# Patient Record
Sex: Male | Born: 1965 | Race: White | Hispanic: No | Marital: Married | Smoking: Never smoker
Health system: Southern US, Community
[De-identification: ages and names within clinical notes are randomized; demographics above are authoritative.]

## PROBLEM LIST (undated history)

## (undated) DIAGNOSIS — I639 Cerebral infarction, unspecified: Secondary | ICD-10-CM

## (undated) DIAGNOSIS — N400 Enlarged prostate without lower urinary tract symptoms: Secondary | ICD-10-CM

## (undated) DIAGNOSIS — J189 Pneumonia, unspecified organism: Secondary | ICD-10-CM

## (undated) DIAGNOSIS — C801 Malignant (primary) neoplasm, unspecified: Secondary | ICD-10-CM

## (undated) DIAGNOSIS — M1990 Unspecified osteoarthritis, unspecified site: Secondary | ICD-10-CM

## (undated) HISTORY — DX: Pneumonia, unspecified organism: J18.9

## (undated) HISTORY — PX: CHOLECYSTECTOMY: SHX55

## (undated) HISTORY — PX: TRANSURETHRAL RESECTION OF PROSTATE: SHX73

## (undated) HISTORY — PX: COLONOSCOPY: SHX174

---

## 2013-02-03 DIAGNOSIS — I639 Cerebral infarction, unspecified: Secondary | ICD-10-CM

## 2013-02-03 HISTORY — DX: Cerebral infarction, unspecified: I63.9

## 2013-09-08 ENCOUNTER — Ambulatory Visit: Payer: Commercial Managed Care - PPO | Admitting: Neurology

## 2015-03-21 ENCOUNTER — Other Ambulatory Visit: Payer: Self-pay

## 2015-04-02 ENCOUNTER — Ambulatory Visit: Payer: Self-pay | Admitting: Surgery

## 2015-04-02 DIAGNOSIS — C434 Malignant melanoma of scalp and neck: Secondary | ICD-10-CM

## 2015-04-02 NOTE — H&P (Signed)
Jose Andrade. Rattan 04/02/2015 9:27 AM Location: [facility] Patient #: [id number] DOB: 02-Dec-1965 Married / Language: English / Race: White Male  History of Present Illness Jose Andrade A. Jose Roswell MD; 04/02/2015 10:30 AM) Patient words: New-Melanoma scalp     Patient sent at the request of Dr. Allyson Sabal for melanoma involving the parietal part of his scalp just left of midline. The patient had a mole that had been changing over the last 6 months. 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 There is no history of ulceration. His deep margin was 0.25 mm and lateral margin 0.75 mm on the shave biopsy. He denies any other complaints today.  The patient is a 50 year old male.   Other Problems [name], CMA; 04/02/2015 9:27 AM) Cerebrovascular Accident Cholelithiasis  Past Surgical History [name], CMA; 04/02/2015 9:27 AM) Gallbladder Surgery - Laparoscopic  Diagnostic Studies History [name], CMA; 04/02/2015 9:27 AM) Colonoscopy >10 years ago  Allergies [name], CMA; 04/02/2015 9:29 AM) No Known Drug Allergies 04/02/2015  Medication History ([name], CMA; 04/02/2015 9:30 AM) Tamsulosin HCl (0.4MG  Capsule, Oral) Active. Medications Reconciled     Review of Systems [name] CMA; 04/02/2015 9:27 AM) General Not Present- Appetite Loss, Chills, Fatigue, Fever, Night Sweats, Weight Gain and Weight Loss. Skin Present- Change in Wart/Mole. Not Present- Dryness, Hives, Jaundice, New Lesions, Non-Healing Wounds, Rash and Ulcer. HEENT Not Present- Earache, Hearing Loss, Hoarseness, Nose Bleed, Oral Ulcers, Ringing in the Ears, Seasonal Allergies, Sinus Pain, Sore Throat, Visual Disturbances, Wears glasses/contact lenses and Yellow Eyes. Breast Not Present- Breast Mass, Breast Pain, Nipple Discharge and Skin Changes. Cardiovascular Present- Leg Cramps. Not Present- Chest Pain, Difficulty Breathing  Lying Down, Palpitations, Rapid Heart Rate, Shortness of Breath and Swelling of Extremities. Gastrointestinal Not Present- Abdominal Pain, Bloating, Bloody Stool, Change in Bowel Habits, Chronic diarrhea, Constipation, Difficulty Swallowing, Excessive gas, Gets full quickly at meals, Hemorrhoids, Indigestion, Nausea, Rectal Pain and Vomiting. Musculoskeletal Present- Joint Pain and Joint Stiffness. Not Present- Back Pain, Muscle Pain, Muscle Weakness and Swelling of Extremities. Neurological Present- Numbness. Not Present- Decreased Memory, Fainting, Headaches, Seizures, Tingling, Tremor, Trouble walking and Weakness. Psychiatric Not Present- Anxiety, Bipolar, Change in Sleep Pattern, Depression, Fearful and Frequent crying. Endocrine Not Present- Cold Intolerance, Excessive Hunger, Hair Changes, Heat Intolerance, Hot flashes and New Diabetes. Hematology Not Present- Easy Bruising, Excessive bleeding, Gland problems, HIV and Persistent Infections.  Vitals ([name] CMA; 04/02/2015 9:29 AM) 04/02/2015 9:29 AM Weight: 238.8 lb Height: 70in Body Surface Area: 2.25 m Body Mass Index: 34.26 kg/m  Temp.: 97.62F(Oral)  BP: 112/80 (Sitting, Left Arm, Standard)      Physical Exam (Imo Cumbie A. Morio Widen MD; 04/02/2015 10:31 AM)  General Mental Status-Alert. General Appearance-Consistent with stated age. Hydration-Well hydrated. Voice-Normal.  Integumentary Note: Parietal scalp 1 cm wound noted. This is just left of midline. His head is shaved.  Head and Neck Head-normocephalic, atraumatic with no lesions or palpable masses. Trachea-midline. Thyroid Gland Characteristics - normal size and consistency.  Eye Eyeball - Bilateral-Extraocular movements intact. Sclera/Conjunctiva - Bilateral-No scleral icterus.  Chest and Lung Exam Chest and lung exam reveals -quiet, even and easy respiratory effort with no use of accessory muscles and on auscultation, normal  breath sounds, no adventitious sounds and normal vocal resonance. Inspection Chest Wall - Normal. Back - normal.  Cardiovascular Cardiovascular examination reveals -normal heart sounds, regular rate and rhythm with no murmurs and normal pedal pulses bilaterally.  Neurologic  Neurologic evaluation reveals -alert and oriented x 3 with no impairment of recent or remote memory. Mental Status-Normal.  Musculoskeletal Normal Exam - Left-Upper Extremity Strength Normal and Lower Extremity Strength Normal. Normal Exam - Right-Upper Extremity Strength Normal and Lower Extremity Strength Normal.  Lymphatic Head & Neck  General Head & Neck Lymphatics: Bilateral - Description - Normal. Axillary  General Axillary Region: Bilateral - Description - Normal. Tenderness - Non Tender.    Assessment & Plan (Angle Dirusso A. 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  Discussed surgery with the patient is Y today. Risks, benefits and alternatives discussed. Discussed the potential wound issues all excising a melanoma of the scalp. Discussed sentinel lymph node mapping in the cervical region as well as possible axilla and potential complications of doing this. They wish to proceed with surgery. Risk of sentinel lymph node mapping include bleeding, infection, lymphedema, shoulder pain. stiffness, dye allergy. cosmetic deformity , blood clots, death, need for more surgery. Pt agres to proceed.    Risk of bleeding, infection, cosmetic deformity, open wound, slow wound healing, and the need for other procedures, nerve injury, blood vessel injury, DVT, cardiovascular complications, death, DVT  DISCUSSED THE NEED FOR SKIN GRAFT, FLAPS AND OTHER CLOSURE TEQUNIQUES   PT AGREES TO PROCEED  Current Plans The anatomy and the physiology was  discussed. The pathophysiology and natural history of the disease was discussed. Options were discussed and recommendations were made. Technique, risks, benefits, & alternatives were discussed. Risks such as stroke, heart attack, bleeding, indection, death, and other risks discussed. Questions answered. The patient agrees to proceed. Pt Education - Melanoma: cancer The pathophysiology of skin & subcutaneous masses was discussed. Natural history risks without surgery were discussed. I recommended surgery to remove the mass. I explained the technique of removal with use of local anesthesia & possible need for more aggressive sedation/anesthesia for patient comfort.  Risks such as bleeding, infection, wound breakdown, heart attack, death, and other risks were discussed. I noted a good likelihood this will help address the problem. Possibility that this will not correct all symptoms was explained. Possibility of regrowth/recurrence of the mass was discussed. We will work to minimize complications. Questions were answered. The patient expresses understanding & wishes to proceed with surgery.  Pt Education - Melanoma: discussed with patient and provided information.

## 2015-04-10 ENCOUNTER — Other Ambulatory Visit: Payer: Self-pay | Admitting: Surgery

## 2015-04-10 DIAGNOSIS — C434 Malignant melanoma of scalp and neck: Secondary | ICD-10-CM

## 2015-04-16 ENCOUNTER — Ambulatory Visit (HOSPITAL_COMMUNITY): Payer: No Typology Code available for payment source

## 2015-04-16 ENCOUNTER — Ambulatory Visit (HOSPITAL_COMMUNITY)
Admission: RE | Admit: 2015-04-16 | Discharge: 2015-04-16 | Disposition: A | Discharge status: Home / Self Care | Payer: No Typology Code available for payment source | Source: Ambulatory Visit | Attending: Surgery | Admitting: Surgery

## 2015-04-16 DIAGNOSIS — C434 Malignant melanoma of scalp and neck: Secondary | ICD-10-CM | POA: Diagnosis not present

## 2015-04-16 MED ORDER — TECHNETIUM TC 99M SULFUR COLLOID FILTERED
1.0000 | Freq: Once | INTRAVENOUS | Status: AC | PRN
  Administered 2015-04-16: 1 via INTRADERMAL

## 2015-04-17 ENCOUNTER — Ambulatory Visit (HOSPITAL_COMMUNITY): Payer: No Typology Code available for payment source

## 2015-05-02 ENCOUNTER — Encounter (HOSPITAL_BASED_OUTPATIENT_CLINIC_OR_DEPARTMENT_OTHER): Payer: Self-pay | Admitting: *Deleted

## 2015-05-09 ENCOUNTER — Ambulatory Visit (HOSPITAL_BASED_OUTPATIENT_CLINIC_OR_DEPARTMENT_OTHER)
Admission: RE | Admit: 2015-05-09 | Discharge: 2015-05-09 | Disposition: A | Discharge status: Home / Self Care | Payer: No Typology Code available for payment source | Source: Ambulatory Visit | Attending: Surgery | Admitting: Surgery

## 2015-05-09 ENCOUNTER — Encounter (HOSPITAL_COMMUNITY)
Admission: RE | Admit: 2015-05-09 | Discharge: 2015-05-09 | Disposition: A | Discharge status: Home / Self Care | Payer: No Typology Code available for payment source | Source: Ambulatory Visit | Attending: Surgery | Admitting: Surgery

## 2015-05-09 ENCOUNTER — Ambulatory Visit (HOSPITAL_BASED_OUTPATIENT_CLINIC_OR_DEPARTMENT_OTHER): Payer: No Typology Code available for payment source | Admitting: Certified Registered"

## 2015-05-09 ENCOUNTER — Encounter (HOSPITAL_BASED_OUTPATIENT_CLINIC_OR_DEPARTMENT_OTHER): Payer: Self-pay

## 2015-05-09 ENCOUNTER — Encounter (HOSPITAL_BASED_OUTPATIENT_CLINIC_OR_DEPARTMENT_OTHER)
Admission: RE | Disposition: A | Discharge status: Home / Self Care | Payer: Self-pay | Source: Ambulatory Visit | Attending: Surgery

## 2015-05-09 DIAGNOSIS — Z6834 Body mass index (BMI) 34.0-34.9, adult: Secondary | ICD-10-CM | POA: Diagnosis not present

## 2015-05-09 DIAGNOSIS — C434 Malignant melanoma of scalp and neck: Secondary | ICD-10-CM | POA: Insufficient documentation

## 2015-05-09 DIAGNOSIS — E669 Obesity, unspecified: Secondary | ICD-10-CM | POA: Insufficient documentation

## 2015-05-09 DIAGNOSIS — Z7982 Long term (current) use of aspirin: Secondary | ICD-10-CM | POA: Insufficient documentation

## 2015-05-09 DIAGNOSIS — I69398 Other sequelae of cerebral infarction: Secondary | ICD-10-CM | POA: Insufficient documentation

## 2015-05-09 DIAGNOSIS — R200 Anesthesia of skin: Secondary | ICD-10-CM | POA: Insufficient documentation

## 2015-05-09 DIAGNOSIS — Z79899 Other long term (current) drug therapy: Secondary | ICD-10-CM | POA: Insufficient documentation

## 2015-05-09 HISTORY — DX: Unspecified osteoarthritis, unspecified site: M19.90

## 2015-05-09 HISTORY — DX: Malignant (primary) neoplasm, unspecified: C80.1

## 2015-05-09 HISTORY — PX: MELANOMA EXCISION WITH SENTINEL LYMPH NODE BIOPSY: SHX5267

## 2015-05-09 HISTORY — DX: Cerebral infarction, unspecified: I63.9

## 2015-05-09 HISTORY — DX: Benign prostatic hyperplasia without lower urinary tract symptoms: N40.0

## 2015-05-09 SURGERY — MELANOMA EXCISION WITH SENTINEL LYMPH NODE BIOPSY
Anesthesia: General | Site: Head

## 2015-05-09 MED ORDER — ONDANSETRON HCL 4 MG/2ML IJ SOLN
INTRAMUSCULAR | Status: AC
  Filled 2015-05-09: qty 2

## 2015-05-09 MED ORDER — OXYCODONE HCL 5 MG PO TABS
5.0000 mg | ORAL_TABLET | Freq: Once | ORAL | Status: AC | PRN
  Administered 2015-05-09: 5 mg via ORAL

## 2015-05-09 MED ORDER — FENTANYL CITRATE (PF) 100 MCG/2ML IJ SOLN
INTRAMUSCULAR | Status: AC
  Filled 2015-05-09: qty 2

## 2015-05-09 MED ORDER — SUCCINYLCHOLINE CHLORIDE 20 MG/ML IJ SOLN
INTRAMUSCULAR | Status: AC
  Filled 2015-05-09: qty 1

## 2015-05-09 MED ORDER — TECHNETIUM TC 99M SULFUR COLLOID FILTERED
0.5000 | Freq: Once | INTRAVENOUS | Status: AC | PRN
  Administered 2015-05-09: 0.5 via INTRADERMAL

## 2015-05-09 MED ORDER — CEFAZOLIN SODIUM 1-5 GM-% IV SOLN
INTRAVENOUS | Status: AC
  Filled 2015-05-09: qty 50

## 2015-05-09 MED ORDER — FENTANYL CITRATE (PF) 100 MCG/2ML IJ SOLN
50.0000 ug | INTRAMUSCULAR | Status: DC | PRN
  Administered 2015-05-09: 50 ug via INTRAVENOUS
  Administered 2015-05-09: 100 ug via INTRAVENOUS

## 2015-05-09 MED ORDER — CHLORHEXIDINE GLUCONATE 4 % EX LIQD: 1.0000 "application " | Freq: Once | CUTANEOUS | Status: DC

## 2015-05-09 MED ORDER — LIDOCAINE HCL (CARDIAC) 20 MG/ML IV SOLN
INTRAVENOUS | Status: DC | PRN
  Administered 2015-05-09: 60 mg via INTRAVENOUS

## 2015-05-09 MED ORDER — OXYCODONE HCL 5 MG PO TABS
ORAL_TABLET | ORAL | Status: AC
  Filled 2015-05-09: qty 1

## 2015-05-09 MED ORDER — SUCCINYLCHOLINE CHLORIDE 20 MG/ML IJ SOLN
INTRAMUSCULAR | Status: DC | PRN
  Administered 2015-05-09: 100 mg via INTRAVENOUS

## 2015-05-09 MED ORDER — LACTATED RINGERS IV SOLN
INTRAVENOUS | Status: DC
  Administered 2015-05-09: 10:00:00 via INTRAVENOUS
  Administered 2015-05-09: 10 mL/h via INTRAVENOUS

## 2015-05-09 MED ORDER — SCOPOLAMINE 1 MG/3DAYS TD PT72: 1.0000 | MEDICATED_PATCH | Freq: Once | TRANSDERMAL | Status: DC | PRN

## 2015-05-09 MED ORDER — PHENYLEPHRINE 40 MCG/ML (10ML) SYRINGE FOR IV PUSH (FOR BLOOD PRESSURE SUPPORT)
PREFILLED_SYRINGE | INTRAVENOUS | Status: AC
  Filled 2015-05-09: qty 10

## 2015-05-09 MED ORDER — DEXAMETHASONE SODIUM PHOSPHATE 10 MG/ML IJ SOLN
INTRAMUSCULAR | Status: AC
  Filled 2015-05-09: qty 1

## 2015-05-09 MED ORDER — BACITRACIN-NEOMYCIN-POLYMYXIN 400-5-5000 EX OINT
TOPICAL_OINTMENT | CUTANEOUS | Status: DC | PRN
  Administered 2015-05-09: 1 via TOPICAL

## 2015-05-09 MED ORDER — MIDAZOLAM HCL 2 MG/2ML IJ SOLN
INTRAMUSCULAR | Status: AC
  Filled 2015-05-09: qty 2

## 2015-05-09 MED ORDER — CEFAZOLIN SODIUM 1 G IJ SOLR
3.0000 g | INTRAMUSCULAR | Status: AC
  Administered 2015-05-09: 2 g via INTRAVENOUS

## 2015-05-09 MED ORDER — GLYCOPYRROLATE 0.2 MG/ML IJ SOLN: 0.2000 mg | Freq: Once | INTRAMUSCULAR | Status: DC | PRN

## 2015-05-09 MED ORDER — DEXAMETHASONE SODIUM PHOSPHATE 4 MG/ML IJ SOLN
INTRAMUSCULAR | Status: DC | PRN
  Administered 2015-05-09: 10 mg via INTRAVENOUS

## 2015-05-09 MED ORDER — PROMETHAZINE HCL 25 MG/ML IJ SOLN: 6.2500 mg | INTRAMUSCULAR | Status: DC | PRN

## 2015-05-09 MED ORDER — ONDANSETRON HCL 4 MG/2ML IJ SOLN
INTRAMUSCULAR | Status: DC | PRN
  Administered 2015-05-09: 4 mg via INTRAVENOUS

## 2015-05-09 MED ORDER — PROPOFOL 500 MG/50ML IV EMUL
INTRAVENOUS | Status: AC
  Filled 2015-05-09: qty 50

## 2015-05-09 MED ORDER — OXYCODONE-ACETAMINOPHEN 5-325 MG PO TABS: 1.0000 | ORAL_TABLET | ORAL | Status: DC | PRN

## 2015-05-09 MED ORDER — PHENYLEPHRINE HCL 10 MG/ML IJ SOLN
INTRAMUSCULAR | Status: DC | PRN
  Administered 2015-05-09 (×4): 40 ug via INTRAVENOUS

## 2015-05-09 MED ORDER — CEFAZOLIN SODIUM-DEXTROSE 2-4 GM/100ML-% IV SOLN
INTRAVENOUS | Status: AC
  Filled 2015-05-09: qty 100

## 2015-05-09 MED ORDER — LIDOCAINE HCL (CARDIAC) 20 MG/ML IV SOLN
INTRAVENOUS | Status: AC
  Filled 2015-05-09: qty 5

## 2015-05-09 MED ORDER — FENTANYL CITRATE (PF) 100 MCG/2ML IJ SOLN
25.0000 ug | INTRAMUSCULAR | Status: DC | PRN
  Administered 2015-05-09 (×3): 25 ug via INTRAVENOUS
  Administered 2015-05-09: 50 ug via INTRAVENOUS

## 2015-05-09 MED ORDER — BUPIVACAINE-EPINEPHRINE 0.25% -1:200000 IJ SOLN
INTRAMUSCULAR | Status: DC | PRN
  Administered 2015-05-09: 6 mL

## 2015-05-09 MED ORDER — METHYLENE BLUE 0.5 % INJ SOLN
INTRAVENOUS | Status: DC | PRN
  Administered 2015-05-09: 1 mL

## 2015-05-09 MED ORDER — PROPOFOL 10 MG/ML IV BOLUS
INTRAVENOUS | Status: DC | PRN
  Administered 2015-05-09: 150 mg via INTRAVENOUS

## 2015-05-09 MED ORDER — MIDAZOLAM HCL 2 MG/2ML IJ SOLN
1.0000 mg | INTRAMUSCULAR | Status: DC | PRN
  Administered 2015-05-09 (×2): 2 mg via INTRAVENOUS

## 2015-05-09 SURGICAL SUPPLY — 42 items
BLADE SURG 15 STRL LF DISP TIS (BLADE) ×2 IMPLANT
BLADE SURG 15 STRL SS (BLADE) ×4
CANISTER SUCT 1200ML W/VALVE (MISCELLANEOUS) ×3 IMPLANT
COVER BACK TABLE 60X90IN (DRAPES) ×3 IMPLANT
COVER MAYO STAND STRL (DRAPES) ×3 IMPLANT
COVER PROBE W GEL 5X96 (DRAPES) ×3 IMPLANT
DRAPE LAPAROTOMY 100X72 PEDS (DRAPES) ×3 IMPLANT
DRAPE UTILITY XL STRL (DRAPES) ×3 IMPLANT
ELECT COATED BLADE 2.86 ST (ELECTRODE) ×3 IMPLANT
ELECT REM PT RETURN 9FT ADLT (ELECTROSURGICAL) ×3
ELECTRODE REM PT RTRN 9FT ADLT (ELECTROSURGICAL) ×1 IMPLANT
GLOVE BIO SURGEON STRL SZ8 (GLOVE) ×3 IMPLANT
GLOVE BIOGEL PI IND STRL 8 (GLOVE) ×2 IMPLANT
GLOVE BIOGEL PI INDICATOR 8 (GLOVE) ×4
GLOVE ECLIPSE 8.0 STRL XLNG CF (GLOVE) ×3 IMPLANT
GOWN STRL REUS W/ TWL LRG LVL3 (GOWN DISPOSABLE) ×2 IMPLANT
GOWN STRL REUS W/TWL LRG LVL3 (GOWN DISPOSABLE) ×4
LIQUID BAND (GAUZE/BANDAGES/DRESSINGS) ×3 IMPLANT
NDL SAFETY ECLIPSE 18X1.5 (NEEDLE) ×1 IMPLANT
NEEDLE HYPO 18GX1.5 SHARP (NEEDLE) ×2
NEEDLE HYPO 25X1 1.5 SAFETY (NEEDLE) ×6 IMPLANT
NS IRRIG 1000ML POUR BTL (IV SOLUTION) ×3 IMPLANT
PACK BASIN DAY SURGERY FS (CUSTOM PROCEDURE TRAY) ×3 IMPLANT
PENCIL BUTTON HOLSTER BLD 10FT (ELECTRODE) ×3 IMPLANT
SLEEVE SCD COMPRESS KNEE MED (MISCELLANEOUS) ×3 IMPLANT
SPONGE GAUZE 4X4 12PLY STER LF (GAUZE/BANDAGES/DRESSINGS) ×3 IMPLANT
SPONGE LAP 4X18 X RAY DECT (DISPOSABLE) ×3 IMPLANT
STAPLER VISISTAT 35W (STAPLE) ×3 IMPLANT
SUT ETHILON 2 0 FS 18 (SUTURE) ×6 IMPLANT
SUT MON AB 4-0 PC3 18 (SUTURE) ×3 IMPLANT
SUT VIC AB 0 SH 27 (SUTURE) ×3 IMPLANT
SUT VIC AB 2-0 SH 27 (SUTURE) ×2
SUT VIC AB 2-0 SH 27XBRD (SUTURE) ×1 IMPLANT
SUT VICRYL 3-0 CR8 SH (SUTURE) ×3 IMPLANT
SYR 5ML LL (SYRINGE) ×3 IMPLANT
SYR CONTROL 10ML LL (SYRINGE) ×3 IMPLANT
TOWEL OR 17X24 6PK STRL BLUE (TOWEL DISPOSABLE) ×3 IMPLANT
TOWEL OR NON WOVEN STRL DISP B (DISPOSABLE) ×3 IMPLANT
TRAY DSU PREP LF (CUSTOM PROCEDURE TRAY) ×3 IMPLANT
TUBE CONNECTING 20'X1/4 (TUBING) ×1
TUBE CONNECTING 20X1/4 (TUBING) ×2 IMPLANT
YANKAUER SUCT BULB TIP NO VENT (SUCTIONS) ×3 IMPLANT

## 2015-05-09 NOTE — Anesthesia Preprocedure Evaluation (Addendum)
Anesthesia Evaluation  Patient identified by MRN, date of birth, ID band Patient awake    Reviewed: Allergy & Precautions, NPO status , Patient's Chart, lab work & pertinent test results  Airway Mallampati: II  TM Distance: >3 FB Neck ROM: Full    Dental  (+) Teeth Intact, Dental Advisory Given   Pulmonary neg pulmonary ROS,    Pulmonary exam normal breath sounds clear to auscultation       Cardiovascular Exercise Tolerance: Good negative cardio ROS Normal cardiovascular exam Rhythm:Regular Rate:Normal     Neuro/Psych CVA (left UE numbness), Residual Symptoms    GI/Hepatic negative GI ROS, Neg liver ROS,   Endo/Other  Obesity   Renal/GU negative Renal ROS     Musculoskeletal  (+) Arthritis , Osteoarthritis,    Abdominal   Peds  Hematology negative hematology ROS (+)   Anesthesia Other Findings Day of surgery medications reviewed with the patient.  Scalp melanoma  Reproductive/Obstetrics                            Anesthesia Physical Anesthesia Plan  ASA: III  Anesthesia Plan: General   Post-op Pain Management:    Induction: Intravenous  Airway Management Planned: Oral ETT  Additional Equipment:   Intra-op Plan:   Post-operative Plan: Extubation in OR  Informed Consent: I have reviewed the patients History and Physical, chart, labs and discussed the procedure including the risks, benefits and alternatives for the proposed anesthesia with the patient or authorized representative who has indicated his/her understanding and acceptance.   Dental advisory given  Plan Discussed with: CRNA  Anesthesia Plan Comments: (Risks/benefits of general anesthesia discussed with patient including risk of damage to teeth, lips, gum, and tongue, nausea/vomiting, allergic reactions to medications, and the possibility of heart attack, stroke and death.  All patient questions answered.  Patient  wishes to proceed.)        Anesthesia Quick Evaluation

## 2015-05-09 NOTE — Discharge Instructions (Signed)
Discharge instructions per Dr.Cornett:  Ice pack to scalp. Neosporin to scalp. May shower tomorrow. May drive when no longer taking pain medication. Make an appointment to see Dr. Brantley Stage next week.    Post Anesthesia Home Care Instructions  Activity: Get plenty of rest for the remainder of the day. A responsible adult should stay with you for 24 hours following the procedure.  For the next 24 hours, DO NOT: -Drive a car -Paediatric nurse -Drink alcoholic beverages -Take any medication unless instructed by your physician -Make any legal decisions or sign important papers.  Meals: Start with liquid foods such as gelatin or soup. Progress to regular foods as tolerated. Avoid greasy, spicy, heavy foods. If nausea and/or vomiting occur, drink only clear liquids until the nausea and/or vomiting subsides. Call your physician if vomiting continues.  Special Instructions/Symptoms: Your throat may feel dry or sore from the anesthesia or the breathing tube placed in your throat during surgery. If this causes discomfort, gargle with warm salt water. The discomfort should disappear within 24 hours.  If you had a scopolamine patch placed behind your ear for the management of post- operative nausea and/or vomiting:  1. The medication in the patch is effective for 72 hours, after which it should be removed.  Wrap patch in a tissue and discard in the trash. Wash hands thoroughly with soap and water. 2. You may remove the patch earlier than 72 hours if you experience unpleasant side effects which may include dry mouth, dizziness or visual disturbances. 3. Avoid touching the patch. Wash your hands with soap and water after contact with the patch.

## 2015-05-09 NOTE — Brief Op Note (Signed)
05/09/2015  11:48 AM  PATIENT:  Jose Andrade  50 y.o. male  PRE-OPERATIVE DIAGNOSIS:  MELANOMA SCALP  POST-OPERATIVE DIAGNOSIS:  MELANOMA SCALP  PROCEDURE:  Procedure(s): WIDE EXCISION MELANOMA AND  SENTINEL LYMPH NODE MAPPING (N/A)  SURGEON:  Surgeon(s) and Role:    * Erroll Luna, MD - Primary  PHYSICIAN ASSISTANT:     ANESTHESIA:   local and general  EBL:  Total I/O In: 1600 [I.V.:1600] Out: -   BLOOD ADMINISTERED:none  DRAINS: none   LOCAL MEDICATIONS USED:  BUPIVICAINE   SPECIMEN:  Source of Specimen:  scalp and left level 2 cervical lymph nodes   DISPOSITION OF SPECIMEN:  PATHOLOGY  COUNTS:  YES  TOURNIQUET:  * No tourniquets in log *  DICTATION: .Other Dictation: Dictation Number  [id number]  PLAN OF CARE: Discharge to home after PACU  PATIENT DISPOSITION:  PACU - hemodynamically stable.   Delay start of Pharmacological VTE agent (>24hrs) due to surgical blood loss or risk of bleeding: not applicable

## 2015-05-09 NOTE — Transfer of Care (Signed)
Immediate Anesthesia Transfer of Care Note  Patient: Jose Andrade  Procedure(s) Performed: Procedure(s): WIDE EXCISION MELANOMA AND  SENTINEL LYMPH NODE MAPPING (N/A)  Patient Location: PACU  Anesthesia Type:General  Level of Consciousness: awake, alert , oriented and patient cooperative  Airway & Oxygen Therapy: Patient Spontanous Breathing and Patient connected to face mask oxygen  Post-op Assessment: Report given to RN and Post -op Vital signs reviewed and stable  Post vital signs: Reviewed and stable  Last Vitals:  Filed Vitals:   05/09/15 0830 05/09/15 0835  BP: 126/81   Pulse: 68 59  Temp:    Resp: 9 9    Complications: No apparent anesthesia complications

## 2015-05-09 NOTE — H&P (Signed)
H&P   Jose Andrade (MR# [id number])      H&P Info    Author Note Status Last Update User Last Update Date/Time   Jose Luna, MD Signed Jose Luna, MD 04/02/2015 10:36 AM    H&P    Expand All Collapse All   Jose Andrade 04/02/2015 9:27 AM Location: [facility] Patient #: [id number] DOB: 09-30-1965 Married / Language: English / Race: White Male  History of Present Illness Jose Moores A. Bettyann Birchler MD; 04/02/2015 10:30 AM) Patient words: New-Melanoma scalp     Patient sent at the request of Dr. Allyson Andrade for melanoma involving the parietal part of his scalp just left of midline. The patient had a mole that had been changing over the last 6 months. 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 There is no history of ulceration. His deep margin was 0.25 mm and lateral margin 0.75 mm on the shave biopsy. He denies any other complaints today.  The patient is a 50 year old male.   Other Problems Jose Andrade, CMA; 04/02/2015 9:27 AM) Cerebrovascular Accident Cholelithiasis  Past Surgical History Jose Andrade, CMA; 04/02/2015 9:27 AM) Gallbladder Surgery - Laparoscopic  Diagnostic Studies History Jose Andrade, CMA; 04/02/2015 9:27 AM) Colonoscopy >10 years ago  Allergies Jose Andrade, CMA; 04/02/2015 9:29 AM) No Known Drug Allergies 04/02/2015  Medication History (Jose Andrade, CMA; 04/02/2015 9:30 AM) Tamsulosin HCl (0.4MG  Capsule, Oral) Active. Medications Reconciled     Review of Systems Jose Andrade CMA; 04/02/2015 9:27 AM) General Not Present- Appetite Loss, Chills, Fatigue, Fever, Night Sweats, Weight Gain and Weight Loss. Skin Present- Change in Wart/Mole. Not Present- Dryness, Hives, Jaundice, New Lesions, Non-Healing Wounds, Rash and Ulcer. HEENT Not Present- Earache, Hearing Loss, Hoarseness, Nose Bleed, Oral Ulcers, Ringing in the Ears, Seasonal Allergies, Sinus Pain, Sore Throat,  Visual Disturbances, Wears glasses/contact lenses and Yellow Eyes. Breast Not Present- Breast Mass, Breast Pain, Nipple Discharge and Skin Changes. Cardiovascular Present- Leg Cramps. Not Present- Chest Pain, Difficulty Breathing Lying Down, Palpitations, Rapid Heart Rate, Shortness of Breath and Swelling of Extremities. Gastrointestinal Not Present- Abdominal Pain, Bloating, Bloody Stool, Change in Bowel Habits, Chronic diarrhea, Constipation, Difficulty Swallowing, Excessive gas, Gets full quickly at meals, Hemorrhoids, Indigestion, Nausea, Rectal Pain and Vomiting. Musculoskeletal Present- Joint Pain and Joint Stiffness. Not Present- Back Pain, Muscle Pain, Muscle Weakness and Swelling of Extremities. Neurological Present- Numbness. Not Present- Decreased Memory, Fainting, Headaches, Seizures, Tingling, Tremor, Trouble walking and Weakness. Psychiatric Not Present- Anxiety, Bipolar, Change in Sleep Pattern, Depression, Fearful and Frequent crying. Endocrine Not Present- Cold Intolerance, Excessive Hunger, Hair Changes, Heat Intolerance, Hot flashes and New Diabetes. Hematology Not Present- Easy Bruising, Excessive bleeding, Gland problems, HIV and Persistent Infections.  Vitals (Jose Andrade CMA; 04/02/2015 9:29 AM) 04/02/2015 9:29 AM Weight: 238.8 lb Height: 70in Body Surface Area: 2.25 m Body Mass Index: 34.26 kg/m  Temp.: 97.52F(Oral)  BP: 112/80 (Sitting, Left Arm, Standard)      Physical Exam (Jose Lafrance A. Adabella Stanis MD; 04/02/2015 10:31 AM)  General Mental Status-Alert. General Appearance-Consistent with stated age. Hydration-Well hydrated. Voice-Normal.  Integumentary Note: Parietal scalp 1 cm wound noted. This is just left of midline. His head is shaved.  Head and Neck Head-normocephalic, atraumatic with no lesions or palpable masses. Trachea-midline. Thyroid Gland Characteristics - normal size and consistency.  Eye Eyeball - Bilateral-Extraocular  movements intact. Sclera/Conjunctiva - Bilateral-No scleral icterus.  Chest and Lung Exam Chest and lung exam reveals -quiet, even and  easy respiratory effort with no use of accessory muscles and on auscultation, normal breath sounds, no adventitious sounds and normal vocal resonance. Inspection Chest Wall - Normal. Back - normal.  Cardiovascular Cardiovascular examination reveals -normal heart sounds, regular rate and rhythm with no murmurs and normal pedal pulses bilaterally.  Neurologic Neurologic evaluation reveals -alert and oriented x 3 with no impairment of recent or remote memory. Mental Status-Normal.  Musculoskeletal Normal Exam - Left-Upper Extremity Strength Normal and Lower Extremity Strength Normal. Normal Exam - Right-Upper Extremity Strength Normal and Lower Extremity Strength Normal.  Lymphatic Head & Neck  General Head & Neck Lymphatics: Bilateral - Description - Normal. Axillary  General Axillary Region: Bilateral - Description - Normal. Tenderness - Non Tender.    Assessment & Plan (Jose Andrade A. 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  Discussed surgery with the patient is Y today. Risks, benefits and alternatives discussed. Discussed the potential wound issues all excising a melanoma of the scalp. Discussed sentinel lymph node mapping in the cervical region as well as possible axilla and potential complications of doing this. They wish to proceed with surgery. Risk of sentinel lymph node mapping include bleeding, infection, lymphedema, shoulder pain. stiffness, dye allergy. cosmetic deformity , blood clots, death, need for more surgery. Pt agres to proceed.    Risk of bleeding, infection, cosmetic deformity, open wound, slow wound healing, and the need for other procedures, nerve injury,  blood vessel injury, DVT, cardiovascular complications, death, DVT  DISCUSSED THE NEED FOR SKIN GRAFT, FLAPS AND OTHER CLOSURE TEQUNIQUES   PT AGREES TO PROCEED  Current Plans The anatomy and the physiology was discussed. The pathophysiology and natural history of the disease was discussed. Options were discussed and recommendations were made. Technique, risks, benefits, & alternatives were discussed. Risks such as stroke, heart attack, bleeding, indection, death, and other risks discussed. Questions answered. The patient agrees to proceed. Pt Education - Melanoma: cancer The pathophysiology of skin & subcutaneous masses was discussed. Natural history risks without surgery were discussed. I recommended surgery to remove the mass. I explained the technique of removal with use of local anesthesia & possible need for more aggressive sedation/anesthesia for patient comfort.  Risks such as bleeding, infection, wound breakdown, heart attack, death, and other risks were discussed. I noted a good likelihood this will help address the problem. Possibility that this will not correct all symptoms was explained. Possibility of regrowth/recurrence of the mass was discussed. We will work to minimize complications. Questions were answered. The patient expresses understanding & wishes to proceed with surgery.  Pt Education - Melanoma: discussed with patient and provided information.

## 2015-05-09 NOTE — Anesthesia Procedure Notes (Signed)
Procedure Name: Intubation Date/Time: 05/09/2015 10:05 AM Performed by: Kingdom Vanzanten D Pre-anesthesia Checklist: Patient identified, Emergency Drugs available, Suction available and Patient being monitored Patient Re-evaluated:Patient Re-evaluated prior to inductionOxygen Delivery Method: Circle System Utilized Preoxygenation: Pre-oxygenation with 100% oxygen Intubation Type: IV induction Ventilation: Mask ventilation without difficulty Laryngoscope Size: Mac and 3 Grade View: Grade I Tube type: Oral Tube size: 7.0 mm Number of attempts: 1 Airway Equipment and Method: Stylet and Oral airway Placement Confirmation: ETT inserted through vocal cords under direct vision,  positive ETCO2 and breath sounds checked- equal and bilateral Secured at: 22 cm Tube secured with: Tape Dental Injury: Teeth and Oropharynx as per pre-operative assessment

## 2015-05-09 NOTE — Interval H&P Note (Signed)
History and Physical Interval Note:  05/09/2015 9:50 AM  Eulis Manly  has presented today for surgery, with the diagnosis of MELANOMA SCALP  The various methods of treatment have been discussed with the patient and family. After consideration of risks, benefits and other options for treatment, the patient has consented to  Procedure(s): [place] (N/A) as a surgical intervention .  The patient's history has been reviewed, patient examined, no change in status, stable for surgery.  I have reviewed the patient's chart and labs.  Questions were answered to the patient's satisfaction.     Anamae Rochelle A.

## 2015-05-09 NOTE — Anesthesia Postprocedure Evaluation (Signed)
Anesthesia Post Note  Patient: Jose Andrade  Procedure(s) Performed: Procedure(s) (LRB): WIDE EXCISION MELANOMA AND  SENTINEL LYMPH NODE MAPPING (N/A)  Patient location during evaluation: PACU Anesthesia Type: General Level of consciousness: awake and alert Pain management: pain level controlled Vital Signs Assessment: post-procedure vital signs reviewed and stable Respiratory status: spontaneous breathing, nonlabored ventilation, respiratory function stable and patient connected to nasal cannula oxygen Cardiovascular status: blood pressure returned to baseline and stable Postop Assessment: no signs of nausea or vomiting Anesthetic complications: no    Last Vitals:  Filed Vitals:   05/09/15 1300 05/09/15 1315  BP: 120/81 109/62  Pulse: 73 80  Temp:    Resp: 14 14    Last Pain:  Filed Vitals:   05/09/15 1322  PainSc: 4                  Catalina Gravel

## 2015-05-10 ENCOUNTER — Encounter (HOSPITAL_BASED_OUTPATIENT_CLINIC_OR_DEPARTMENT_OTHER): Payer: Self-pay | Admitting: Surgery

## 2015-05-10 ENCOUNTER — Other Ambulatory Visit (HOSPITAL_COMMUNITY): Payer: Self-pay | Admitting: Specialist

## 2015-05-10 NOTE — Op Note (Signed)
Jose Andrade, YAPP NO.:  [id number]  MEDICAL RECORD NO.:  [id number]  LOCATION:                                 FACILITY:  PHYSICIAN:  Joeseph Verville A. Quinnley Colasurdo, M.D.DATE OF BIRTH:  1965/12/20  DATE OF PROCEDURE: DATE OF DISCHARGE:                              OPERATIVE REPORT   PREOPERATIVE DIAGNOSIS:  Stage II melanoma of scalp.  POSTOPERATIVE DIAGNOSIS:  Stage II melanoma of scalp.  PROCEDURES: 1. Wide excision of scalp melanoma with 1 cm margins with intermediate     closure of 4 cm incision. 2. Left cervical sentinel lymph node mapping using methylene blue dye.  SURGEON:  [name] A. Evian Salguero, M.D.  ANESTHESIA:  LMA anesthesia with 0.25% Sensorcaine with epinephrine local.  EBL:  Minimal.  SPECIMENS: 1. Scalp skin from melanoma excision. 2. Three left cervical level 2 lymph nodes, measuring 3 mm to 4 mm in     maximal diameter, blue and hot.  INDICATIONS FOR PROCEDURE:  The patient is a 50 year old male with melanoma of his scalp.  We discussed wide excision with sentinel lymph node mapping.  We discussed the rationale for sentinel lymph node mapping, the pros and cons of doing it in this circumstance, and the reason to do it since the lymph nodes have been the best prognostic indicator of melanoma outcome.  We discussed the NCCN guidelines as well as potentially omitting sentinel lymph node mapping.  I discussed that this can be done for very thin melanomas, but for thicker melanomas or intermediate thickness melanomas, it is usually recommended.  For thin melanomas, it can be omitted unless the patient desires to have this done.  We discussed all this preoperatively.  I discussed the potential risks of surgery of scalp excision to include, but not exclusive of, bleeding, infection, open wound packing, the need for other surgical procedures, recurrence of melanoma, injury to the skull, cosmetic deformity, numbness around the incision.  I discussed  sentinel lymph node mapping of the cervical chain.  He had lymphoscintigraphy, which showed 2 what looked like level 2 to level 3 cervical lymph nodes.  I discussed the risk of bleeding, infection, injury to nerve structures in the neck to include the facial nerve, the ansa cervicalis, the deep nerves of the neck to include the vagus nerve, the phrenic nerve, and other nerves that go to the face to control sensation and motor function to the face, and potentially other structures of the diaphragm and the heart.  We talked about injury to the major blood vessels, the parotid gland, the submandibular gland, thyroid gland, esophagus, trachea, as well as thyroid and other neck structures that could come into play with sentinel lymph node mapping of the neck.  After discussion of all potential complications of this operative procedure, he wished to proceed with wide excision and sentinel lymph node mapping.  DESCRIPTION OF PROCEDURE:  The patient was met in the holding area.  I marked his previous biopsy site on the scalp and discussed the case again with the patient and his wife.  They had no further questions.  We then proceeded to the operating room.  He underwent injection by Nuclear Medicine prior to  going back.  He was placed supine with both arms tucked and general anesthesia was initiated with LMA.  The scalp and neck were prepped and draped in a sterile fashion.  Time-out was done. I injected a mL of methylene blue dye just in the subcutaneous fat just below the melanoma of the scalp.  This was massaged.  Wide excision to 1 cm was done around the lesion with a curvilinear incision on both sides of the old scar.  Hemostasis was achieved, and we used 0 Vicryl and 3-0 nylon to close the scalp wound.  Intermediate closure was done for approximately 4 cm because of the scalp had to be mobilized for 2 to 3 cm circumferentially to do this.  We then focused our attention to the left neck  using the Neoprobe, this is the only hotspot area he had. Hotspots were identified, which were in the level 2 to level 3 region of the left neck.  Of the 2 hot spots identified, 1 was just posterior to the sternocleidomastoid and 1 was just anterior to it.  I put a 3 cm incision between the 2, so I could reach both.  Dissection was carried down through the platysma muscle and it was separated with hemostats. Retractors were put in to stretch this.  The sternocleidomastoid mastoid muscle was identified.  I was able to dissect posterior to that and retract that anteriorly.  The lymph nodes were deep to the cervical chain.  Hot blue sentinel nodes were identified in the vicinity of more anterior toward the facial nerve.  Care was taken to stay well away from this and careful dissection in the vicinity of this lymph node, which was a level 2 node was done.  Care was taken to stay away from hypopharynx.  I carefully dissected a very small 3 mm blue hot node in this vicinity.  This took some time, and care was taken not to injure any of the adjacent structures.  We were more lateral and posterior to the carotid vessels and jugular vessels.  This was then removed and was hot blue, but was very small and actually came out in 2 small fragments at that time.  The 2nd node was identified.  It was a little more anterior and could be reached by retracting the sternocleidomastoid more superiorly.  This was up toward more of its posterior ear direction below the sternocleidomastoid muscle.  I very carefully used the Neoprobe and dissected this out.  Care was taken to stay away from the facial nerve as well as the hypoglossal nerve.  We dissected out this level 2 node, which was actually more superficial being more behind the sternocleidomastoid muscle in the upper portion of level 2.  This again was very small and was about 3 mm to 4 mm in maximal diameter.  A 2nd node above this which was just a small  was removed and both of these were blue and hot.  At this point in time, background counts were below the 10% threshold of injection site.  Hemostasis was achieved.  I reexamined the operative field and saw no evidence of blood vessel or nerve injury to structures that we were dissecting around.  I saw no evidence of any sort of significant lymph, lymphatic leak.  I then reapproximated the fascia of the platysma muscle allowing the sternocleidomastoid muscle to return back to its position with 3-0 Vicryl.  A 4-0 Monocryl was used to close the skin.  Liquid adhesive applied.  All final counts of sponge, needles, and instruments found to be correct at this portion of the case.  The patient was then awoke, extubated, and taken to recovery in satisfactory condition.     Renesmae Donahey A. Rosell Khouri, M.D.     TAC/MEDQ  D:  05/09/2015  T:  05/10/2015  Job:  [id number]

## 2015-06-01 ENCOUNTER — Telehealth: Payer: Self-pay | Admitting: Hematology and Oncology

## 2015-06-01 NOTE — Telephone Encounter (Signed)
Verified address and insurance, contacted referring provider with date/time, spoke with pt regarding appt and completed intak

## 2015-06-04 ENCOUNTER — Ambulatory Visit: Payer: No Typology Code available for payment source | Admitting: Hematology and Oncology

## 2015-06-06 ENCOUNTER — Encounter: Payer: Self-pay | Admitting: Hematology and Oncology

## 2015-06-06 DIAGNOSIS — C434 Malignant melanoma of scalp and neck: Secondary | ICD-10-CM | POA: Insufficient documentation

## 2015-06-07 ENCOUNTER — Telehealth: Payer: Self-pay | Admitting: Hematology and Oncology

## 2015-06-07 ENCOUNTER — Encounter: Payer: Self-pay | Admitting: Hematology and Oncology

## 2015-06-07 ENCOUNTER — Ambulatory Visit (HOSPITAL_BASED_OUTPATIENT_CLINIC_OR_DEPARTMENT_OTHER): Payer: No Typology Code available for payment source | Admitting: Hematology and Oncology

## 2015-06-07 VITALS — BP 121/81 | HR 67 | Temp 98.6°F | Resp 18 | Wt 238.8 lb

## 2015-06-07 DIAGNOSIS — C434 Malignant melanoma of scalp and neck: Secondary | ICD-10-CM | POA: Diagnosis not present

## 2015-06-07 DIAGNOSIS — I6381 Other cerebral infarction due to occlusion or stenosis of small artery: Secondary | ICD-10-CM | POA: Insufficient documentation

## 2015-06-07 DIAGNOSIS — I639 Cerebral infarction, unspecified: Secondary | ICD-10-CM

## 2015-06-07 NOTE — Progress Notes (Signed)
[facility] CONSULT NOTE  Patient Care Team: Provider Not In System as PCP - General  CHIEF COMPLAINTS/PURPOSE OF CONSULTATION:  Malignant melanoma  HISTORY OF PRESENTING ILLNESS:  Jose Andrade 50 y.o. male is here because of recent diagnosis of melanoma. The patient has a strong family history of melanoma in his sister and other cancers in other family members. The patient has excessive sun exposure growing up. He used to serve in [employer] for 7 years and used to have drills in the sun. He also goes fishing a lot with friends not wearing shirts. He never used suntanning booth. The patient is known to have multiple moles on his body. Several months ago, after a business meeting, his colleague recommended urgent evaluation of a growing mole at the top of his head. It was noticed that the mole was growing rapidly in size over a period of 4 months leading to the urgent dermatology review in February 2017. He denies bleeding or itching over the mole prior to excision. I review his records extensively and summarized as follows:   Malignant melanoma of scalp ([place])   03/21/2015 Pathology Results Accession: [id number] Mid parietal skin biopsy confirmed maligant melanoma 1.28 mm superficial spreading type, Clark level IV without ulceration   05/09/2015 Surgery Wide excision of scalp melanoma with 1 cm margins with intermediate closure of 4 cm incision. 2. Left cervical sentinel lymph node mapping using methylene blue dye.   05/09/2015 Pathology Results Accession: [id number] surgical specimen showed no residual disease, 0.3 cm to the nearest closest margin (based on central scar), negative sentinel lymph node biopsy   He denies significant complications after his surgery except for numbness around the left side of the neck. 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 He complained of numbness and tingling throughout the left side of his body and  limit some of his physical activities.  MEDICAL HISTORY:  Past Medical History  Diagnosis Date  . Stroke ([place]) 2015    numbness in lt arm and leg  . Arthritis     left elbow  . BPH (benign prostatic hyperplasia)     on Tamsulosin  . Cancer ([place])     melanoma of scalp    SURGICAL HISTORY: Past Surgical History  Procedure Laterality Date  . Cholecystectomy    . Melanoma excision with sentinel lymph node biopsy N/A 05/09/2015    Procedure: WIDE EXCISION MELANOMA AND  SENTINEL LYMPH NODE MAPPING;  Surgeon: Erroll Luna, MD;  Location: [facility];  Service: General;  Laterality: N/A;    SOCIAL HISTORY: Social History   Social History  . Marital Status: Married    Spouse Name: N/A  . Number of Children: N/A  . Years of Education: N/A   Occupational History  . Not on file.   Social History Main Topics  . Smoking status: Never Smoker   . Smokeless tobacco: Never Used  . Alcohol Use: Yes     Comment: social  . Drug Use: No  . Sexual Activity: Not on file     Comment: married, working; 2 sons, 1 daughter   Other Topics Concern  . Not on file   Social History Narrative    FAMILY HISTORY: Family History  Problem Relation Age of Onset  . Cancer Mother 31    breast ca, stomach ca  . Cancer Sister 70    melanoma  . Cancer Brother     leukemia  ALLERGIES:  has No Known Allergies.  MEDICATIONS:  Current Outpatient Prescriptions  Medication Sig Dispense Refill  . aspirin 81 MG tablet Take 81 mg by mouth daily.    . tamsulosin (FLOMAX) 0.4 MG CAPS capsule Take 0.4 mg by mouth.     No current facility-administered medications for this visit.    REVIEW OF SYSTEMS:   Constitutional: Denies fevers, chills or abnormal night sweats Eyes: Denies blurriness of vision, double vision or watery eyes Ears, nose, mouth, throat, and face: Denies mucositis or sore throat Respiratory: Denies cough, dyspnea or wheezes Cardiovascular: Denies palpitation, chest  discomfort or lower extremity swelling Gastrointestinal:  Denies nausea, heartburn or change in bowel habits Lymphatics: Denies new lymphadenopathy or easy bruising Neurological: He has residual tingling and numbness on the left side of his body Behavioral/Psych: Mood is stable, no new changes  All other systems were reviewed with the patient and are negative.  PHYSICAL EXAMINATION: ECOG PERFORMANCE STATUS: 0 - Asymptomatic  Filed Vitals:   06/07/15 1031  BP: 121/81  Pulse: 67  Temp: 98.6 F (37 C)  Resp: 18   Filed Weights   06/07/15 1031  Weight: 238 lb 12.8 oz (108.319 kg)    GENERAL:alert, no distress and comfortable. He is mildly obese but muscular SKIN: Well-healed surgical scar on the top of his scalp. He has multiple moles throughout his body but nothing suspicious for melanoma EYES: normal, conjunctiva are pink and non-injected, sclera clear OROPHARYNX:no exudate, no erythema and lips, buccal mucosa, and tongue normal  NECK: supple, well-healed surgical scar on the left side of his neck, thyroid normal size, non-tender, without nodularity LYMPH:  no palpable lymphadenopathy in the cervical, axillary or inguinal LUNGS: clear to auscultation and percussion with normal breathing effort HEART: regular rate & rhythm and no murmurs and no lower extremity edema ABDOMEN:abdomen soft, non-tender and normal bowel sounds Musculoskeletal:no cyanosis of digits and no clubbing  PSYCH: alert & oriented x 3 with fluent speech NEURO: no focal motor/sensory deficits  I reviewed his surgical report and pathology reports ASSESSMENT & PLAN:  Malignant melanoma of scalp ([place]) I reviewed the current guidelines with the patient. He has early stage I disease. With a strong family history of melanoma and other cancers, I recommend consultation with genetic counselor. I reinforced the importance of skin protection with the use of sunscreen, longsleeve shirt, protective hat and avoidance of  excessive sun exposure I reinforced the importance of close follow-up with dermatologist I will transition his care to cancer survivorship clinic with possibilities of discharge afterwards and follow-up with dermatologist only in the future We discussed the importance of screening programs. The patient is interested to lose some weight and I recommend the [employer] program with local [employer]. I also reinforced the importance of vitamin D supplement  Stroke of unknown etiology [facility]) The patient has residual left-sided deficit from prior history of stroke. The cause is unknown but it was attributed to significant heatstroke. He will continue aspirin therapy and risk factor modifications     All questions were answered. The patient knows to call the clinic with any problems, questions or concerns. I spent 40 minutes counseling the patient face to face. The total time spent in the appointment was 55 minutes and more than 50% was on counseling.     [place], Melbeta, MD 06/07/2015 1:19 PM

## 2015-06-07 NOTE — Telephone Encounter (Signed)
Pt referred to genetics.Elissa Lovett msg to sched him

## 2015-06-07 NOTE — Assessment & Plan Note (Signed)
The patient has residual left-sided deficit from prior history of stroke. The cause is unknown but it was attributed to significant heatstroke. He will continue aspirin therapy and risk factor modifications

## 2015-06-07 NOTE — Assessment & Plan Note (Addendum)
I reviewed the current guidelines with the patient. He has early stage I disease. With a strong family history of melanoma and other cancers, I recommend consultation with genetic counselor. I reinforced the importance of skin protection with the use of sunscreen, longsleeve shirt, protective hat and avoidance of excessive sun exposure I reinforced the importance of close follow-up with dermatologist I will transition his care to cancer survivorship clinic with possibilities of discharge afterwards and follow-up with dermatologist only in the future We discussed the importance of screening programs. The patient is interested to lose some weight and I recommend the [employer] program with local [employer]. I also reinforced the importance of vitamin D supplement

## 2015-07-05 ENCOUNTER — Telehealth: Payer: Self-pay | Admitting: Adult Health

## 2015-07-05 NOTE — Telephone Encounter (Signed)
I attempted to reach Mr. Leconte to introduce myself, survivorship, and my role in his care, as requested by a referral from Dr. Alvy Bimler.  In my voicemail, I let Mr. Brunkhorst know that I would mail him a copy of his appt calendar, along with other information about survivorship and he is welcome to contact me any time with any questions, concerns, or if he needs to change his appt.   I have mailed a letter, appointment calendar, survivorship brochure, and my business card to the patient's home.  I look forward to participating in his care!  Mike Craze, NP [place] [phone]

## 2015-12-06 ENCOUNTER — Ambulatory Visit (AMBULATORY_SURGERY_CENTER): Payer: Self-pay | Admitting: *Deleted

## 2015-12-06 VITALS — Ht 70.0 in | Wt 244.0 lb

## 2015-12-06 DIAGNOSIS — Z1211 Encounter for screening for malignant neoplasm of colon: Secondary | ICD-10-CM

## 2015-12-06 MED ORDER — NA SULFATE-K SULFATE-MG SULF 17.5-3.13-1.6 GM/177ML PO SOLN: ORAL | 0 refills | Status: DC

## 2015-12-06 NOTE — Progress Notes (Signed)
No egg or soy allergy  No anesthesia or intubation problems per pt  No diet medications taken  30% coupon given to pt

## 2015-12-12 ENCOUNTER — Encounter: Payer: No Typology Code available for payment source | Admitting: Adult Health

## 2015-12-25 ENCOUNTER — Encounter: Payer: Self-pay | Admitting: Internal Medicine

## 2015-12-25 ENCOUNTER — Ambulatory Visit (AMBULATORY_SURGERY_CENTER): Payer: No Typology Code available for payment source | Admitting: Internal Medicine

## 2015-12-25 VITALS — BP 100/73 | HR 68 | Temp 96.4°F | Resp 16 | Ht 70.0 in | Wt 244.0 lb

## 2015-12-25 DIAGNOSIS — Z1211 Encounter for screening for malignant neoplasm of colon: Secondary | ICD-10-CM | POA: Diagnosis present

## 2015-12-25 DIAGNOSIS — Z1212 Encounter for screening for malignant neoplasm of rectum: Secondary | ICD-10-CM

## 2015-12-25 MED ORDER — SODIUM CHLORIDE 0.9 % IV SOLN: 500.0000 mL | INTRAVENOUS | Status: AC

## 2015-12-25 NOTE — Patient Instructions (Signed)
YOU HAD AN ENDOSCOPIC PROCEDURE TODAY AT [facility]:   Refer to the procedure report that was given to you for any specific questions about what was found during the examination.  If the procedure report does not answer your questions, please call your gastroenterologist to clarify.  If you requested that your care partner not be given the details of your procedure findings, then the procedure report has been included in a sealed envelope for you to review at your convenience later.  YOU SHOULD EXPECT: Some feelings of bloating in the abdomen. Passage of more gas than usual.  Walking can help get rid of the air that was put into your GI tract during the procedure and reduce the bloating. If you had a lower endoscopy (such as a colonoscopy or flexible sigmoidoscopy) you may notice spotting of blood in your stool or on the toilet paper. If you underwent a bowel prep for your procedure, you may not have a normal bowel movement for a few days.  Please Note:  You might notice some irritation and congestion in your nose or some drainage.  This is from the oxygen used during your procedure.  There is no need for concern and it should clear up in a day or so.  SYMPTOMS TO REPORT IMMEDIATELY:   Following lower endoscopy (colonoscopy or flexible sigmoidoscopy):  Excessive amounts of blood in the stool  Significant tenderness or worsening of abdominal pains  Swelling of the abdomen that is new, acute  Fever of 100F or higher   Following upper endoscopy (EGD)  Vomiting of blood or coffee ground material  New chest pain or pain under the shoulder blades  Painful or persistently difficult swallowing  New shortness of breath  Fever of 100F or higher  Black, tarry-looking stools  For urgent or emergent issues, a gastroenterologist can be reached at any hour by calling [phone].   DIET:  We do recommend a small meal at first, but then you may proceed to your regular diet.  Drink  plenty of fluids but you should avoid alcoholic beverages for 24 hours.  ACTIVITY:  You should plan to take it easy for the rest of today and you should NOT DRIVE or use heavy machinery until tomorrow (because of the sedation medicines used during the test).    FOLLOW UP: Our staff will call the number listed on your records the next business day following your procedure to check on you and address any questions or concerns that you may have regarding the information given to you following your procedure. If we do not reach you, we will leave a message.  However, if you are feeling well and you are not experiencing any problems, there is no need to return our call.  We will assume that you have returned to your regular daily activities without incident.  If any biopsies were taken you will be contacted by phone or by letter within the next 1-3 weeks.  Please call us at [phone] if you have not heard about the biopsies in 3 weeks.    SIGNATURES/CONFIDENTIALITY: You and/or your care partner have signed paperwork which will be entered into your electronic medical record.  These signatures attest to the fact that that the information above on your After Visit Summary has been reviewed and is understood.  Full responsibility of the confidentiality of this discharge information lies with you and/or your care-partner.     You may resume your current medications today. Repeat  colonoscopy in 10 years for screening purposes. Please call if any questions or concerns.

## 2015-12-25 NOTE — Progress Notes (Signed)
Report given to PACU RN, vss 

## 2015-12-25 NOTE — Op Note (Signed)
[facility] Patient Name: Jose Andrade Procedure Date: 12/25/2015 9:09 AM MRN: [id number] Endoscopist: Docia Chuck. Henrene Pastor , MD Age: 50 Referring MD:  Date of Birth: November 04, 1965 Gender: Male Account #: [id number] Procedure:                Colonoscopy Indications:              Screening for colorectal malignant neoplasm Medicines:                Monitored Anesthesia Care Procedure:                Pre-Anesthesia Assessment:                           - Prior to the procedure, a History and Physical                            was performed, and patient medications and                            allergies were reviewed. The patient's tolerance of                            previous anesthesia was also reviewed. The risks                            and benefits of the procedure and the sedation                            options and risks were discussed with the patient.                            All questions were answered, and informed consent                            was obtained. Prior Anticoagulants: The patient has                            taken no previous anticoagulant or antiplatelet                            agents. ASA Grade Assessment: I - A normal, healthy                            patient. After reviewing the risks and benefits,                            the patient was deemed in satisfactory condition to                            undergo the procedure.                           After obtaining informed consent, the colonoscope  was passed under direct vision. Throughout the                            procedure, the patient's blood pressure, pulse, and                            oxygen saturations were monitored continuously. The                            Model CF-HQ190L [id number]) scope was introduced                            through the anus and advanced to the the cecum,                            identified by appendiceal  orifice and ileocecal                            valve. The ileocecal valve, appendiceal orifice,                            and rectum were photographed. The quality of the                            bowel preparation was excellent. The colonoscopy                            was performed without difficulty. The patient                            tolerated the procedure well. The bowel preparation                            used was SUPREP. Scope In: 9:19:53 AM Scope Out: 9:30:41 AM Scope Withdrawal Time: 0 hours 9 minutes 5 seconds  Total Procedure Duration: 0 hours 10 minutes 48 seconds  Findings:                 The entire examined colon appeared normal on direct                            and retroflexion views. Complications:            No immediate complications. Estimated blood loss:                            None. Estimated Blood Loss:     Estimated blood loss: none. Impression:               - The entire examined colon is normal on direct and                            retroflexion views.                           - No specimens collected. Recommendation:           -  Repeat colonoscopy in 10 years for screening                            purposes.                           - Patient has a contact number available for                            emergencies. The signs and symptoms of potential                            delayed complications were discussed with the                            patient. Return to normal activities tomorrow.                            Written discharge instructions were provided to the                            patient.                           - Resume previous diet.                           - Continue present medications. Docia Chuck. Henrene Pastor, MD 12/25/2015 9:34:44 AM This report has been signed electronically.

## 2015-12-25 NOTE — Progress Notes (Signed)
No problems noted in the recovery room. maw 

## 2015-12-26 ENCOUNTER — Telehealth: Payer: Self-pay

## 2015-12-26 ENCOUNTER — Telehealth: Payer: Self-pay | Admitting: *Deleted

## 2015-12-26 NOTE — Telephone Encounter (Signed)
  Follow up Call-  Call back number 12/25/2015  Post procedure Call Back phone  # [phone]  Permission to leave phone message Yes     Patient questions:  Do you have a fever, pain , or abdominal swelling? No. Pain Score  0 *  Have you tolerated food without any problems? Yes.    Have you been able to return to your normal activities? Yes.    Do you have any questions about your discharge instructions: Diet   No. Medications  No. Follow up visit  No.  Do you have questions or concerns about your Care? No.  Actions: * If pain score is 4 or above: No action needed, pain <4.

## 2015-12-26 NOTE — Telephone Encounter (Signed)
No identifier, left message, follow-up  

## 2016-06-17 DIAGNOSIS — N400 Enlarged prostate without lower urinary tract symptoms: Secondary | ICD-10-CM | POA: Insufficient documentation

## 2016-09-25 DIAGNOSIS — N320 Bladder-neck obstruction: Secondary | ICD-10-CM | POA: Insufficient documentation

## 2017-04-30 DIAGNOSIS — M542 Cervicalgia: Secondary | ICD-10-CM | POA: Insufficient documentation

## 2017-04-30 DIAGNOSIS — M5412 Radiculopathy, cervical region: Secondary | ICD-10-CM | POA: Insufficient documentation

## 2017-05-11 DIAGNOSIS — G4486 Cervicogenic headache: Secondary | ICD-10-CM | POA: Insufficient documentation

## 2017-08-07 DIAGNOSIS — M62838 Other muscle spasm: Secondary | ICD-10-CM | POA: Insufficient documentation

## 2017-08-07 DIAGNOSIS — M4802 Spinal stenosis, cervical region: Secondary | ICD-10-CM | POA: Insufficient documentation

## 2017-08-25 DIAGNOSIS — E559 Vitamin D deficiency, unspecified: Secondary | ICD-10-CM | POA: Insufficient documentation

## 2017-09-08 ENCOUNTER — Ambulatory Visit: Payer: 59 | Admitting: Clinical

## 2017-09-08 DIAGNOSIS — F4323 Adjustment disorder with mixed anxiety and depressed mood: Secondary | ICD-10-CM | POA: Diagnosis not present

## 2017-09-10 DIAGNOSIS — M5030 Other cervical disc degeneration, unspecified cervical region: Secondary | ICD-10-CM | POA: Insufficient documentation

## 2017-10-01 ENCOUNTER — Ambulatory Visit: Payer: 59 | Admitting: Clinical

## 2017-10-01 DIAGNOSIS — F331 Major depressive disorder, recurrent, moderate: Secondary | ICD-10-CM | POA: Diagnosis not present

## 2017-10-29 ENCOUNTER — Ambulatory Visit: Payer: 59 | Admitting: Clinical

## 2017-10-29 DIAGNOSIS — F331 Major depressive disorder, recurrent, moderate: Secondary | ICD-10-CM | POA: Diagnosis not present

## 2018-02-28 IMAGING — NM NM LYMPHATICS/LYMPH NODE
8 series · 8 of 8 positions shown · non-contrast
Comparison: None.

CLINICAL DATA: Patient with history of melanoma along the superior
scalp, left of midline. Evaluate for sentinel lymph node.

EXAM:
NUCLEAR MEDICINE LYMPHANGIOGRAPHY
TECHNIQUE: Sequential images were obtained following intradermal injection of
radiopharmaceutical at the tumor site in the scalp.
RADIOPHARMACEUTICALS:  495 mCi millipore-filtered 1c-VVm sulfur
colloid

[Series 1: lymph ant/post · 2.07mm/px · 1 of 1 slices shown (1 of 2)]
[im 1/1]
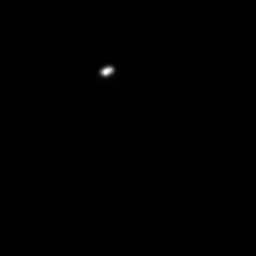

[Series 1: lymph ant/post · 2.07mm/px · 1 of 1 slices shown (2 of 2)]
[im 1/1]
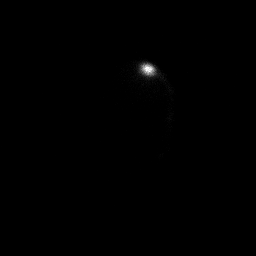

[Series 2: lymph lats · 2.07mm/px · 1 of 1 slices shown (1 of 2)]
[im 1/1]
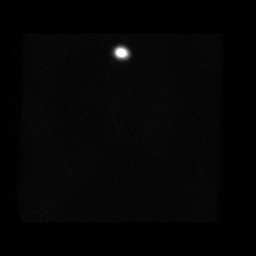

[Series 2: lymph lats · 2.07mm/px · 1 of 1 slices shown (2 of 2)]
[im 1/1]
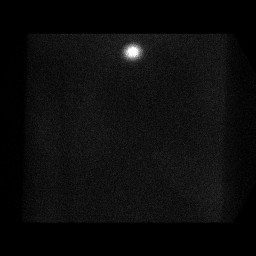

[Series 3: lymph ant/post 2 · 2.07mm/px · 1 of 1 slices shown (1 of 2)]
[im 1/1]
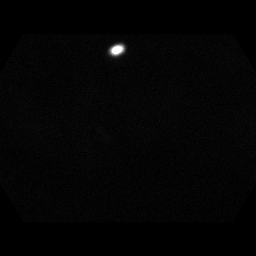

[Series 3: lymph ant/post 2 · 2.07mm/px · 1 of 1 slices shown (2 of 2)]
[im 1/1]
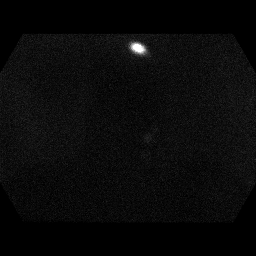

[Series 4: lymph lats 2 · 2.07mm/px · 1 of 1 slices shown (1 of 2)]
[im 1/1]
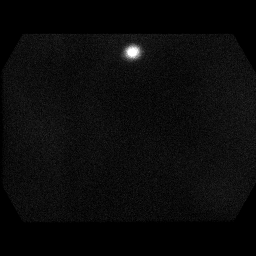

[Series 4: lymph lats 2 · 2.07mm/px · 1 of 1 slices shown (2 of 2)]
[im 1/1]
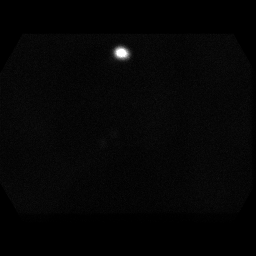

[8 of 8 positions shown; findings below may reference images not displayed]

FINDINGS: Immediate and 1 hour projection images were obtained. They
demonstrate radiotracer within the scalp at the injection site. The
1 hour images demonstrate uptake within 2 left-sided cervical lymph
nodes on both the anterior and lateral projections.
IMPRESSION: Successful injection for sentinel lymph node localization.

## 2018-11-23 DIAGNOSIS — G2581 Restless legs syndrome: Secondary | ICD-10-CM | POA: Insufficient documentation

## 2018-11-23 DIAGNOSIS — I10 Essential (primary) hypertension: Secondary | ICD-10-CM | POA: Insufficient documentation

## 2018-11-23 DIAGNOSIS — J302 Other seasonal allergic rhinitis: Secondary | ICD-10-CM | POA: Insufficient documentation

## 2020-06-24 ENCOUNTER — Emergency Department (HOSPITAL_BASED_OUTPATIENT_CLINIC_OR_DEPARTMENT_OTHER): Payer: PRIVATE HEALTH INSURANCE

## 2020-06-24 ENCOUNTER — Other Ambulatory Visit: Payer: Self-pay

## 2020-06-24 ENCOUNTER — Encounter (HOSPITAL_BASED_OUTPATIENT_CLINIC_OR_DEPARTMENT_OTHER): Payer: Self-pay | Admitting: *Deleted

## 2020-06-24 ENCOUNTER — Emergency Department (HOSPITAL_BASED_OUTPATIENT_CLINIC_OR_DEPARTMENT_OTHER)
Admission: EM | Admit: 2020-06-24 | Discharge: 2020-06-24 | Disposition: A | Discharge status: Home / Self Care | Payer: PRIVATE HEALTH INSURANCE | Attending: Emergency Medicine | Admitting: Emergency Medicine

## 2020-06-24 DIAGNOSIS — S61012A Laceration without foreign body of left thumb without damage to nail, initial encounter: Secondary | ICD-10-CM | POA: Insufficient documentation

## 2020-06-24 DIAGNOSIS — Z7982 Long term (current) use of aspirin: Secondary | ICD-10-CM | POA: Insufficient documentation

## 2020-06-24 DIAGNOSIS — S6992XA Unspecified injury of left wrist, hand and finger(s), initial encounter: Secondary | ICD-10-CM | POA: Diagnosis present

## 2020-06-24 DIAGNOSIS — Z85828 Personal history of other malignant neoplasm of skin: Secondary | ICD-10-CM | POA: Insufficient documentation

## 2020-06-24 DIAGNOSIS — W312XXA Contact with powered woodworking and forming machines, initial encounter: Secondary | ICD-10-CM | POA: Insufficient documentation

## 2020-06-24 DIAGNOSIS — Z23 Encounter for immunization: Secondary | ICD-10-CM | POA: Diagnosis not present

## 2020-06-24 MED ORDER — OXYCODONE-ACETAMINOPHEN 5-325 MG PO TABS
1.0000 | ORAL_TABLET | Freq: Once | ORAL | Status: AC
Start: 2020-06-24 — End: 2020-06-24
  Administered 2020-06-24: 1 via ORAL
  Filled 2020-06-24: qty 1

## 2020-06-24 MED ORDER — CEPHALEXIN 250 MG PO CAPS: 500.0000 mg | ORAL_CAPSULE | Freq: Three times a day (TID) | ORAL | 0 refills | Status: AC

## 2020-06-24 MED ORDER — TETANUS-DIPHTH-ACELL PERTUSSIS 5-2.5-18.5 LF-MCG/0.5 IM SUSY
0.5000 mL | PREFILLED_SYRINGE | Freq: Once | INTRAMUSCULAR | Status: AC
  Administered 2020-06-24: 0.5 mL via INTRAMUSCULAR
  Filled 2020-06-24: qty 0.5

## 2020-06-24 MED ORDER — LIDOCAINE HCL (PF) 1 % IJ SOLN
10.0000 mL | Freq: Once | INTRAMUSCULAR | Status: AC
  Administered 2020-06-24: 5 mL
  Filled 2020-06-24: qty 10

## 2020-06-24 NOTE — ED Triage Notes (Signed)
Cut left thumb with table saw PTA, bleeding controlled

## 2020-06-24 NOTE — ED Notes (Signed)
Left thumb soaking in betadine with sterile water, bleeding is controlled, pts left thumb is very sensitive to pain when cking capillary refill.

## 2020-06-24 NOTE — ED Notes (Signed)
ED Provider at bedside. 

## 2020-06-24 NOTE — ED Notes (Signed)
DSG applied w/ splint by EMT, AVS instructions reviewed with client, observing for signs and symptoms of infection. Also informed of when to have sutures removal. Discussed pain control. Opportunity for questions provided. Rx for abx also provided and pt teaching done in regards to the importance of completing all of the abx prescribed.

## 2020-06-24 NOTE — Discharge Instructions (Addendum)
1. Medications: Tylenol or ibuprofen for pain, antibiotics to prevent infection, usual home medications 2. Treatment: ice for swelling, keep wound clean with warm soap and water and keep bandage dry 3. Follow Up: Please return in 10-14 days to have your stitches removed or sooner if you have concerns. Return to the emergency department for increased redness, drainage of pus from the wound   WOUND CARE  Remove bandage and wash wound gently with mild soap and warm water daily. Reapply a new bandage after cleaning wound as needed.   Continue daily cleansing with soap and water until stitches are removed.  Do not apply any ointments or creams to the wound while stitches are in place, as this may cause delayed healing. Return if you experience any of the following signs of infection: Swelling, redness, pus drainage, streaking, fever >101.0 F  Return if you experience excessive bleeding that does not stop after 15-20 minutes of constant, firm pressure.

## 2020-06-24 NOTE — ED Provider Notes (Signed)
[facility] EMERGENCY DEPARTMENT Provider Note   CSN: [id number] Arrival date & time: 06/24/20  1054     History Chief Complaint  Patient presents with  . Laceration    Left thumb    Jose Andrade is a 55 y.o. male presenting for evaluation of thumb injury and pain.  Patient is a just prior to arrival he was using a table saw when it slipped, and he cut his left thumb.  He reports acute onset pain and bleeding.  He denies injury elsewhere.  He does not know when his last tetanus shot was.  He denies numbness or tingling.  He has not taken anything for pain including Tylenol or ibuprofen.  He is not on blood thinners.  He is not immunocompromised.  Pain is constant, worse with movement.  Does not radiate.  HPI     Past Medical History:  Diagnosis Date  . Arthritis    left elbow  . BPH (benign prostatic hyperplasia)    on Tamsulosin  . Cancer (HCC)    melanoma of scalp  . Pneumonia    3 xs- last episode 2016  . Stroke [facility]) 2015   numbness in lt arm and leg- 2015, only on ASA 81 mg 12-06-15    Patient Active Problem List   Diagnosis Date Noted  . Stroke of unknown etiology ([place]) 06/07/2015  . Malignant melanoma of scalp ([place]) 06/06/2015    Past Surgical History:  Procedure Laterality Date  . CHOLECYSTECTOMY    . COLONOSCOPY    . MELANOMA EXCISION WITH SENTINEL LYMPH NODE BIOPSY N/A 05/09/2015   Procedure: WIDE EXCISION MELANOMA AND  SENTINEL LYMPH NODE MAPPING;  Surgeon: Erroll Luna, MD;  Location: [facility];  Service: General;  Laterality: N/A;  . TRANSURETHRAL RESECTION OF PROSTATE         Family History  Problem Relation Age of Onset  . Cancer Mother 87       breast ca, stomach ca  . Stomach cancer Mother   . Breast cancer Mother   . Cancer Sister 17       melanoma  . Cancer Brother        leukemia  . Colon cancer Neg Hx   . Esophageal cancer Neg Hx   . Rectal cancer Neg Hx     Social History   Tobacco Use  .  Smoking status: Never Smoker  . Smokeless tobacco: Never Used  Substance Use Topics  . Alcohol use: Yes    Comment: social  . Drug use: No    Home Medications Prior to Admission medications   Medication Sig Start Date End Date Taking? Authorizing Provider  cephALEXin (KEFLEX) 250 MG capsule Take 2 capsules (500 mg total) by mouth 3 (three) times daily for 3 days. 06/24/20 06/27/20 Yes Carilyn Woolston, PA-C  aspirin 81 MG tablet Take 81 mg by mouth daily.    [provider]  tamsulosin (FLOMAX) 0.4 MG CAPS capsule Take 0.4 mg by mouth.    [provider]    Allergies    Patient has no known allergies.  Review of Systems   Review of Systems  Skin: Positive for wound.  Hematological: Does not bruise/bleed easily.    Physical Exam Updated Vital Signs BP 131/75 (BP Location: Right Arm)   Pulse 73   Temp 98 F (36.7 C) (Oral)   Resp 17   Ht 5\' 10"  (1.778 m)   Wt 108.9 kg   SpO2 97%  BMI 34.44 kg/m   Physical Exam Vitals and nursing note reviewed.  Constitutional:      General: He is not in acute distress.    Appearance: He is well-developed.  HENT:     Head: Normocephalic and atraumatic.  Pulmonary:     Effort: Pulmonary effort is normal.  Abdominal:     General: There is no distension.  Musculoskeletal:     Cervical back: Normal range of motion.     Comments: 2 cm wound of the left thumb on the palmar aspect between the IP and MCP.  No active bleeding.  Full active range of motion with pain at the IP when held in isolation.  Full active range of motion at the MCP.  Good distal sensation and cap refill.  Skin:    General: Skin is warm.     Capillary Refill: Capillary refill takes less than 2 seconds.     Findings: No rash.  Neurological:     Mental Status: He is alert and oriented to person, place, and time.        ED Results / Procedures / Treatments   Labs (all labs ordered are listed, but only abnormal results are displayed) Labs  Reviewed - No data to display  EKG None  Radiology DG Finger Thumb Left  Result Date: 06/24/2020 CLINICAL DATA:  55 year old male with left thumb laceration. Injury sustained on a table saw. Evaluate for fracture. EXAM: LEFT THUMB 2+V COMPARISON:  None. FINDINGS: There is no evidence of fracture or dislocation. There is no evidence of arthropathy or other focal bone abnormality. Soft tissues are unremarkable. IMPRESSION: Negative. Electronically Signed   By: Jacqulynn Cadet M.D.   On: 06/24/2020 11:49    Procedures .Marland KitchenLaceration Repair  Date/Time: 06/24/2020 1:04 PM Performed by: Franchot Heidelberg, PA-C Authorized by: Franchot Heidelberg, PA-C   Consent:    Consent obtained:  Verbal   Consent given by:  Patient   Risks discussed:  Infection, need for additional repair, nerve damage, poor wound healing, poor cosmetic result and pain Anesthesia:    Anesthesia method:  Nerve block   Block location:  L thumb   Block needle gauge:  25 G   Block anesthetic:  Lidocaine 1% w/o epi   Block technique:  Ring block   Block injection procedure:  Anatomic landmarks palpated, anatomic landmarks identified, introduced needle, negative aspiration for blood and incremental injection   Block outcome:  Anesthesia achieved Laceration details:    Location:  Finger   Finger location:  L thumb   Length (cm):  2   Depth (mm):  3 Pre-procedure details:    Preparation:  Patient was prepped and draped in usual sterile fashion and imaging obtained to evaluate for foreign bodies Exploration:    Wound exploration: wound explored through full range of motion and entire depth of wound visualized     Wound extent: no nerve damage noted, no tendon damage noted and no underlying fracture noted   Treatment:    Area cleansed with:  Povidone-iodine   Amount of cleaning:  Extensive Skin repair:    Repair method:  Sutures   Suture size:  3-0   Suture material:  Prolene   Suture technique:  Simple interrupted    Number of sutures:  5 Approximation:    Approximation:  Close Repair type:    Repair type:  Simple Post-procedure details:    Dressing:  Bulky dressing   Procedure completion:  Tolerated well, no immediate complications  Medications Ordered in ED Medications  Tdap (BOOSTRIX) injection 0.5 mL (0.5 mLs Intramuscular Given 06/24/20 1122)  lidocaine (PF) (XYLOCAINE) 1 % injection 10 mL (5 mLs Infiltration Given 06/24/20 1142)  oxyCODONE-acetaminophen (PERCOCET/ROXICET) 5-325 MG per tablet 1 tablet (1 tablet Oral Given 06/24/20 1143)    ED Course  I have reviewed the triage vital signs and the nursing notes.  Pertinent labs & imaging results that were available during my care of the patient were reviewed by me and considered in my medical decision making (see chart for details).    MDM Rules/Calculators/A&P                           Pain presenting for evaluation of thumb laceration.  On exam, patient is neurovascularly intact.  Will obtain x-rays to ensure no bony abnormality.  X-ray viewed and independently interpreted by me, no fracture or dislocation.  Laceration repaired as described above.  Discussed aftercare instructions.  As wound is on the flexor surface of the finger, will place on antibiotics prevent infection.  Discussed warning signs with patient.  At this time, patient appears safe for discharge.  Return precautions given.  Patient states he understands and agrees to plan.  Final Clinical Impression(s) / ED Diagnoses Final diagnoses:  Laceration of left thumb without foreign body without damage to nail, initial encounter    Rx / DC Orders ED Discharge Orders         Ordered    cephALEXin (KEFLEX) 250 MG capsule  3 times daily        06/24/20 [place], Mael Delap, PA-C 06/24/20 1306    Hayden Rasmussen, MD 06/24/20 1753

## 2020-06-24 NOTE — ED Notes (Signed)
No relief from PO pain med, cont to c/o left thumb pain

## 2020-06-24 NOTE — ED Notes (Signed)
Ring removed off left ring finger, wife has the ring.

## 2020-07-17 ENCOUNTER — Ambulatory Visit: Payer: Self-pay | Admitting: Surgery

## 2020-07-17 DIAGNOSIS — R222 Localized swelling, mass and lump, trunk: Secondary | ICD-10-CM

## 2020-07-17 NOTE — H&P (Signed)
Eulis Manly Appointment: 07/17/2020 10:15 AM Location: [facility] Patient #: [id number] DOB: 30-Aug-1965 Married / Language: Cleophus Molt / Race: White Male   History of Present Illness Adin Hector MD; 07/17/2020 10:31 AM) The patient is a 55 year old male who presents with a soft tissue mass. Note for "Soft tissue mass": ` ` ` Patient sent for surgical consultation at the request of Gloris [place], [place].  Dermatology  Chief Complaint: Mass on back.  Possible lipoma versus other soft tissue tumor.  Consider excision. ` ` The patient is a gentleman with numerous skin lesions.  History of melanoma on scalp excised in the past.  Did not require more aggressive treatment.  Followed by dermatology.  Mass noted in back.  Possible lipoma versus other tumor.  Surgical consultation recommended to consider removal or at least advice.  Patient notes he's had it for over a year.  He notes gradually gotten larger in size.  He occasionally gets some twinges of discomfort or numbness radiating to his left shoulder and upper arm.  He is rather physically active.  [employer] finishing.  Works at a [employer] with moderate activity.  He does not smoke.  Not diabetic.  He thinks he might have a he stroke at one point passing out but no true cardiac or pulmonary issues.  He takes a baby aspirin.  Blood pressure under control.  His weight has been stable.  No history of fall or trauma.  No history of MRSA.  (Review of systems as stated in this history (HPI) or in the review of systems.  Otherwise all other 12 point ROS are negative) ` ` ###########################################`  This patient encounter took 25 minutes today to perform the following: obtain history, perform exam, review outside records, interpret tests & imaging, counsel the patient on their diagnosis; and, document this encounter, including findings & plan in the electronic health record (EHR).   Past Surgical History Flint Melter, CMA; 07/17/2020 10:07 AM) Gallbladder Surgery - Laparoscopic   TURP   Vasectomy    Allergies (Raven Sneed, CMA; 07/17/2020 10:05 AM) No Known Drug Allergies   [04/02/2015]: Allergies Reconciled    Medication History (Raven Sneed, CMA; 07/17/2020 10:07 AM) Olmesartan Medoxomil  (5MG  Tablet, Oral) Active. rOPINIRole HCl  (0.5MG  Tablet, Oral) Active. Tamsulosin HCl  (0.4MG  Capsule, Oral) Active. Dramamine  (50MG  Tablet, 1 (one) Tablet Oral four times daily, as needed, Taken starting 05/21/2015) Active. Tamsulosin HCl  (0.4MG  Capsule, Oral) Active. Medications Reconciled   Social History (Raven Sneed, CMA; 07/17/2020 10:07 AM) Alcohol use   Occasional alcohol use. Caffeine use   Coffee. No drug use   Tobacco use   Never smoker.  Family History Flint Melter, CMA; 07/17/2020 10:07 AM) Alcohol Abuse   Brother, Mother. Breast Cancer   Mother. Cancer   Mother. Colon Cancer   Mother. Diabetes Mellitus   Father. Heart Disease   Father, Mother. Hypertension   Brother, Father. Melanoma   Father. Migraine Headache   Mother. Ovarian Cancer   Mother.  Other Problems (Raven Sneed, CMA; 07/17/2020 10:07 AM) Cerebrovascular Accident   High blood pressure   Melanoma   Migraine Headache      Review of Systems ([place]; 07/17/2020 10:07 AM) General Not Present- Appetite Loss, Chills, Fatigue, Fever, Night Sweats, Weight Gain and Weight Loss. Skin Present- Non-Healing Wounds. Not Present- Change in Wart/Mole, Dryness, Hives, Jaundice, New Lesions, Rash and Ulcer. HEENT Present- Seasonal Allergies and Wears glasses/contact lenses. Not Present- Earache, Hearing  Loss, Hoarseness, Nose Bleed, Oral Ulcers, Ringing in the Ears, Sinus Pain, Sore Throat, Visual Disturbances and Yellow Eyes. Respiratory Present- Snoring. Not Present- Bloody sputum, Chronic Cough, Difficulty Breathing and Wheezing. Breast Not Present- Breast Mass, Breast Pain, Nipple Discharge and Skin Changes. Cardiovascular  Present- Leg Cramps and Swelling of Extremities. Not Present- Chest Pain, Difficulty Breathing Lying Down, Palpitations, Rapid Heart Rate and Shortness of Breath. Gastrointestinal Not Present- Abdominal Pain, Bloating, Bloody Stool, Change in Bowel Habits, Chronic diarrhea, Constipation, Difficulty Swallowing, Excessive gas, Gets full quickly at meals, Hemorrhoids, Indigestion, Nausea, Rectal Pain and Vomiting. Male Genitourinary Not Present- Blood in Urine, Change in Urinary Stream, Frequency, Impotence, Nocturia, Painful Urination, Urgency and Urine Leakage. Musculoskeletal Present- Joint Stiffness and Swelling of Extremities. Not Present- Back Pain, Joint Pain, Muscle Pain and Muscle Weakness. Neurological Present- Numbness. Not Present- Decreased Memory, Fainting, Headaches, Seizures, Tingling, Tremor, Trouble walking and Weakness. Psychiatric Not Present- Anxiety, Bipolar, Change in Sleep Pattern, Depression, Fearful and Frequent crying. Endocrine Not Present- Cold Intolerance, Excessive Hunger, Hair Changes, Heat Intolerance, Hot flashes and New Diabetes. Hematology Not Present- Blood Thinners, Easy Bruising, Excessive bleeding, Gland problems, HIV and Persistent Infections.  Vitals (Raven Sneed CMA; 07/17/2020 10:05 AM) 07/17/2020 10:05 AM Weight: 247 lb   Height: 69 in  Body Surface Area: 2.26 m   Body Mass Index: 36.48 kg/m   Temp.: 97.7 F    Pulse: 78 (Regular)    P.OX: 99% (Room air)       Physical Exam Adin Hector MD; 07/17/2020 10:20 AM) General Mental Status - Alert. General Appearance - Not in acute distress, Not Sickly. Orientation - Oriented X3. Hydration - Well hydrated. Voice - Normal.  Integumentary Global Assessment Upon inspection and palpation of skin surfaces of the - Axillae: non-tender, no inflammation or ulceration, no drainage. and Distribution of scalp and body hair is normal. General Characteristics Temperature - normal warmth is noted.  Head and  Neck Head - normocephalic, atraumatic with no lesions or palpable masses. Face Global Assessment - atraumatic, no absence of expression. Neck Global Assessment - no abnormal movements, no bruit auscultated on the right, no bruit auscultated on the left, no decreased range of motion, non-tender. Trachea - midline. Thyroid Gland Characteristics - non-tender.  Eye Eyeball - Left - Extraocular movements intact, No Nystagmus - Left. Eyeball - Right - Extraocular movements intact, No Nystagmus - Right. Cornea - Left - No Hazy - Left. Cornea - Right - No Hazy - Right. Sclera/Conjunctiva - Left - No scleral icterus, No Discharge - Left. Sclera/Conjunctiva - Right - No scleral icterus, No Discharge - Right. Pupil - Left - Direct reaction to light normal. Pupil - Right - Direct reaction to light normal.  ENMT Ears Pinna - Left - no drainage observed, no generalized tenderness observed. Pinna - Right - no drainage observed, no generalized tenderness observed. Nose and Sinuses External Inspection of the Nose - no destructive lesion observed. Inspection of the nares - Left - quiet respiration. Inspection of the nares - Right - quiet respiration. Mouth and Throat Lips - Upper Lip - no fissures observed, no pallor noted. Lower Lip - no fissures observed, no pallor noted. Nasopharynx - no discharge present. Oral Cavity/Oropharynx - Tongue - no dryness observed. Oral Mucosa - no cyanosis observed. Hypopharynx - no evidence of airway distress observed.  Chest and Lung Exam Inspection Movements - Normal and Symmetrical. Accessory muscles - No use of accessory muscles in breathing. Palpation Palpation of the chest reveals -  Non-tender. Auscultation Breath sounds - Normal and Clear.  Cardiovascular Auscultation Rhythm - Regular. Murmurs & Other Heart Sounds - Auscultation of the heart reveals - No Murmurs and No Systolic Clicks.  Abdomen Inspection Inspection of the abdomen reveals - No Visible  peristalsis and No Abnormal pulsations. Umbilicus - No Bleeding, No Urine drainage. Palpation/Percussion Palpation and Percussion of the abdomen reveal - Soft, Non Tender, No Rebound tenderness, No Rigidity (guarding) and No Cutaneous hyperesthesia. Note:  Abdomen soft.  Nontender.  Not distended.  Mild suprapubic umbilical diastases recti. No umbilical or incisional hernias.  No guarding.   Male Genitourinary Sexual Maturity Tanner 5 - Adult hair pattern and Adult penile size and shape. Note:  No inguinal hernias or lymphadenopathy. No sores or lesions. No hydradenitis.   Peripheral Vascular Upper Extremity Inspection - Left - No Cyanotic nailbeds - Left, Not Ischemic. Inspection - Right - No Cyanotic nailbeds - Right, Not Ischemic.  Neurologic Neurologic evaluation reveals  - normal attention span and ability to concentrate, able to name objects and repeat phrases. Appropriate fund of knowledge , normal sensation and normal coordination. Mental Status Affect - not angry, not paranoid. Cranial Nerves - Normal Bilaterally. Gait - Normal.  Neuropsychiatric Mental status exam performed with findings of - able to articulate well with normal speech/language, rate, volume and coherence, thought content normal with ability to perform basic computations and apply abstract reasoning and no evidence of hallucinations, delusions, obsessions or homicidal/suicidal ideation.  Musculoskeletal Global Assessment Spine, Ribs and Pelvis - no instability, subluxation or laxity. Right Upper Extremity - no instability, subluxation or laxity. Note:  In upper left back just above shoulder blade is a 7 x 7 cm soft tissue ellipsoid massive fixed to the skin. No erythema or swelling. No irregularity or nodularity. No other skin or soft tissue masses on the back.  Left thumb and splint from recent suture repair. No cellulitis or ischemia.   Lymphatic Head & Neck  General Head & Neck Lymphatics: Bilateral -  Description - No Localized lymphadenopathy. Axillary  General Axillary Region: Bilateral - Description - No Localized lymphadenopathy. Femoral & Inguinal  Generalized Femoral & Inguinal Lymphatics: Left - Description - No Localized lymphadenopathy. Right - Description - No Localized lymphadenopathy.    Assessment & Plan Adin Hector MD; 07/17/2020 10:29 AM) SUBCUTANEOUS MASS OF BACK (R22.2) Impression: Subcutaneous soft tissue mass of upper back above left shoulder blade and enlarging in size with some occasional discomfort and tingling. Most likely a typical sebaceous cyst or lipoma.  Because it is getting larger and more bothersome, it is reasonable to remove. He does have a history of melanoma and other skin/soft tissue tumors. I doubt this is a sarcoma.  Given the size and location and possible depth, 1 at least monitored deep sedation by anesthesia. Possible General. Hopefully good local block. Should be an outpatient surgery.  I did note some has need to leave a drain depending on the volume of the soft tissue defect. We will see. Questions answered. Patient and spouse wish to proceed. He wants get back to fishing and doing moderate activity when it is safe. I cautioned I would at least be 2 weeks if not longer. We will see. Current Plans You are being scheduled for surgery - Our schedulers will call you.   You should hear from our office's scheduling department within 5 working days about the location, date, and time of surgery.  We try to make accommodations for patient's preferences in scheduling surgery,  but sometimes the OR schedule or the surgeon's schedule prevents [place] from making those accommodations.  If you have not heard from our office [phone]) in 5 working days, call the office and ask for your surgeon's nurse.  If you have other questions about your diagnosis, plan, or surgery, call the office and ask for your surgeon's nurse.  The pathophysiology of skin &  subcutaneous masses was discussed.  Natural history risks without surgery were discussed.  I recommended surgery to remove the mass.  I explained the technique of removal with use of local anesthesia & possible need for more aggressive sedation/anesthesia for patient comfort.   Risks such as bleeding, infection, wound breakdown, heart attack, death, and other risks were discussed.  I noted a good likelihood this will help address the problem.   Possibility that this will not correct all symptoms was explained. Possibility of regrowth/recurrence of the mass was discussed.  We will work to minimize complications. Questions were answered.  The patient expresses understanding & wishes to proceed with surgery.    Signed electronically by Adin Hector, MD (07/17/2020 10:31 AM)

## 2020-09-27 ENCOUNTER — Other Ambulatory Visit: Payer: Self-pay | Admitting: Surgery

## 2023-05-09 IMAGING — CR DG FINGER THUMB 2+V*L*
3 series · 3 of 3 positions shown · non-contrast
Comparison: None.

CLINICAL DATA: 54-year-old male with left thumb laceration. Injury
sustained on a table saw. Evaluate for fracture.

EXAM:
LEFT THUMB 2+V

[x finger pa left (1 of 2)]
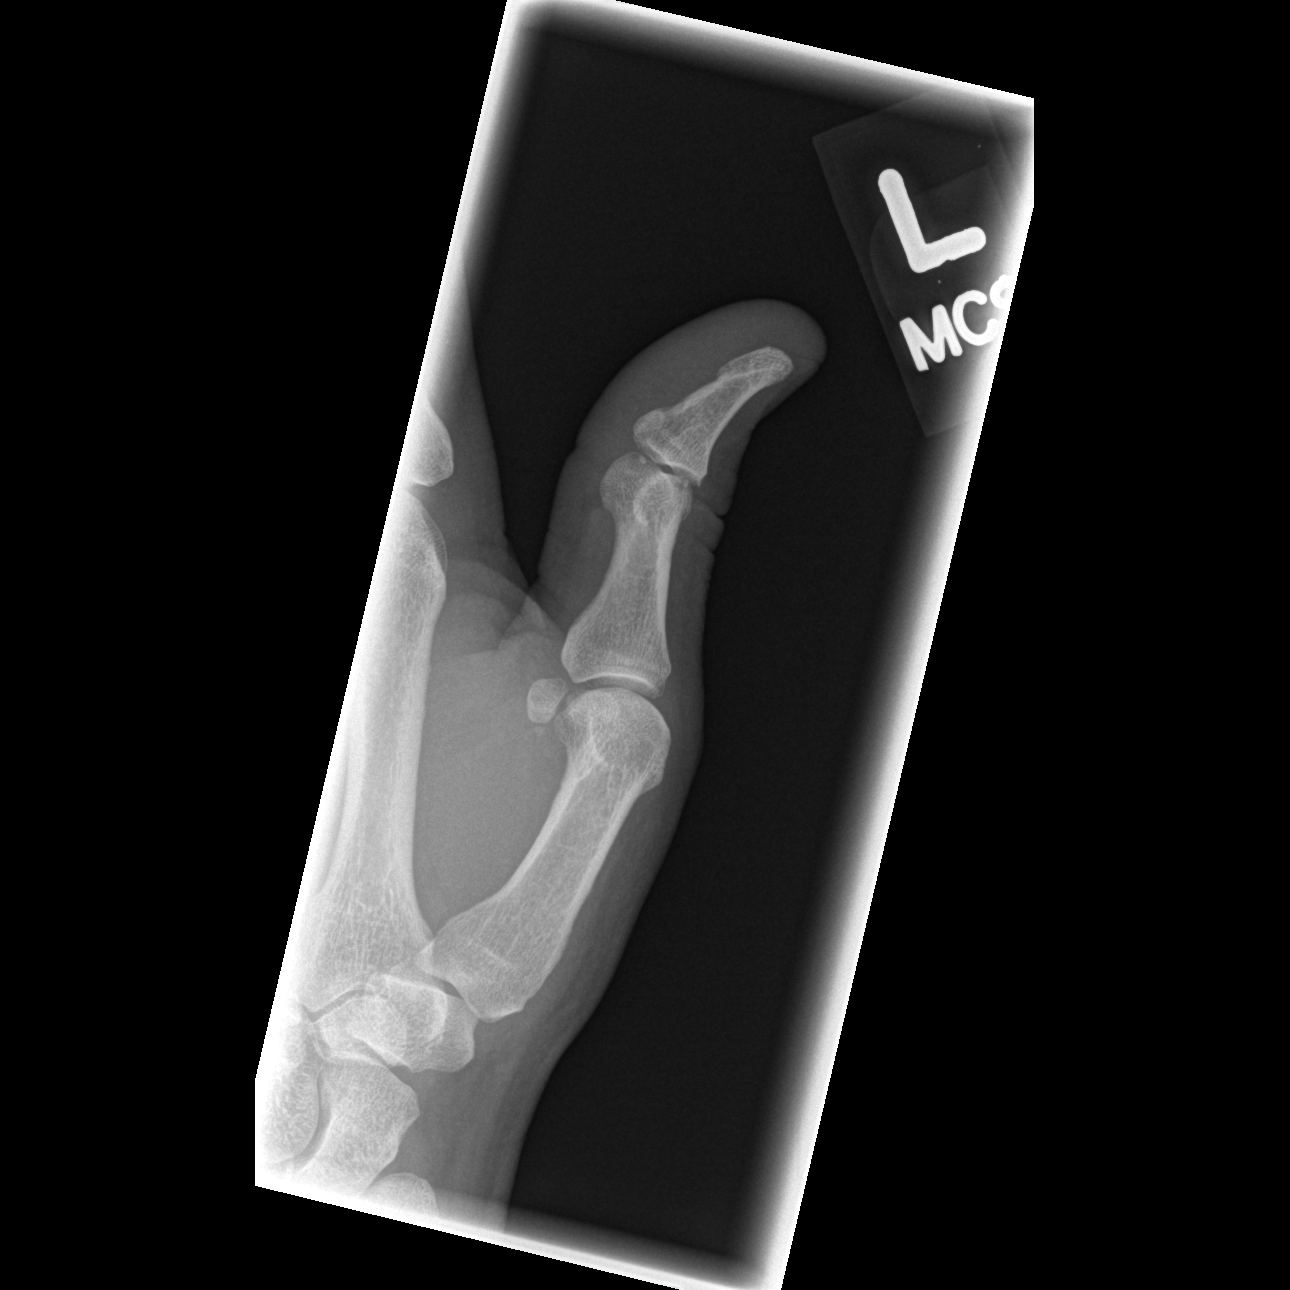

[x finger lateral left]
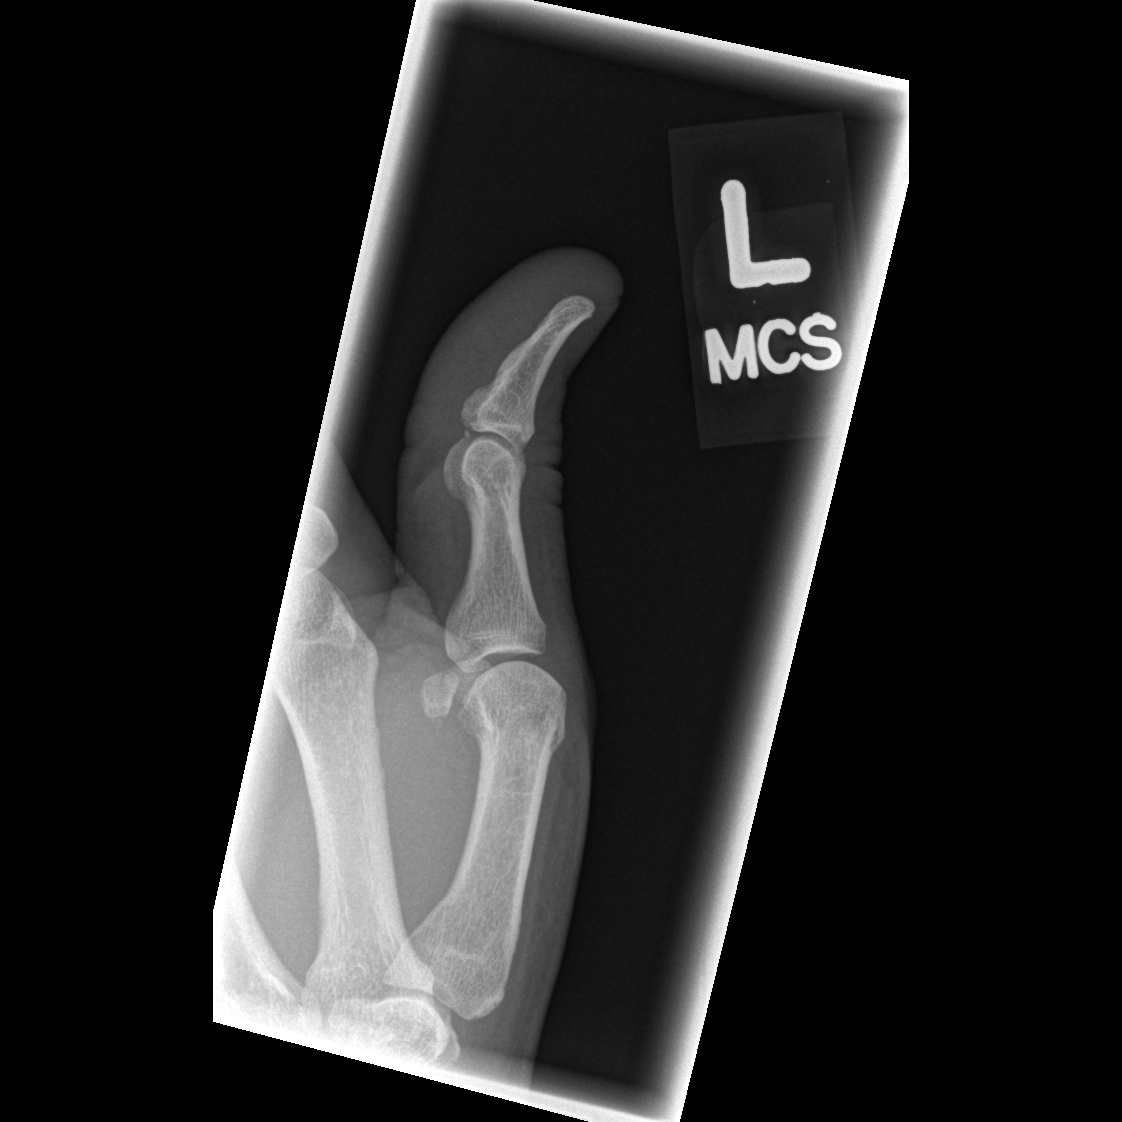

[x finger pa left (2 of 2)]
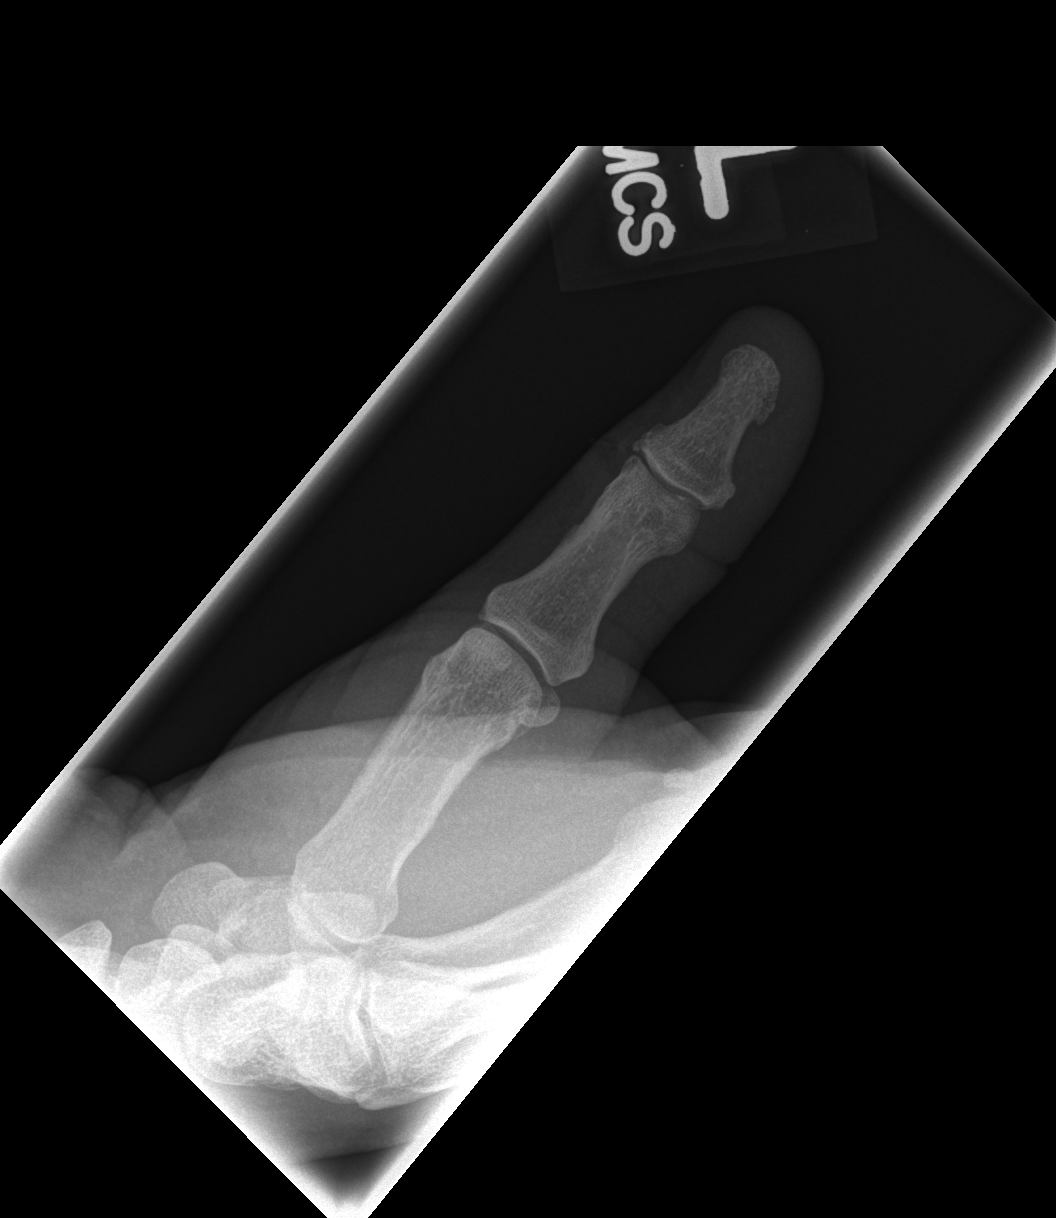

[3 of 3 positions shown; findings below may reference images not displayed]

FINDINGS: There is no evidence of fracture or dislocation. There is no
evidence of arthropathy or other focal bone abnormality. Soft
tissues are unremarkable.
IMPRESSION: Negative.

## 2023-12-01 ENCOUNTER — Other Ambulatory Visit: Payer: Self-pay | Admitting: Physician Assistant

## 2023-12-03 LAB — DERMATOLOGY PATHOLOGY
